# Patient Record
Sex: Male | Born: 1955 | Race: White | Hispanic: No | Marital: Married | Smoking: Never smoker
Health system: Southern US, Community
[De-identification: ages and names within clinical notes are randomized; demographics above are authoritative.]

## PROBLEM LIST (undated history)

## (undated) DIAGNOSIS — I639 Cerebral infarction, unspecified: Secondary | ICD-10-CM

## (undated) DIAGNOSIS — E785 Hyperlipidemia, unspecified: Secondary | ICD-10-CM

---

## 2013-10-25 ENCOUNTER — Emergency Department (HOSPITAL_COMMUNITY)
Admission: EM | Admit: 2013-10-25 | Discharge: 2013-10-26 | Disposition: A | Discharge status: Home / Self Care | Payer: Managed Care, Other (non HMO) | Attending: Emergency Medicine | Admitting: Emergency Medicine

## 2013-10-25 ENCOUNTER — Encounter (HOSPITAL_COMMUNITY): Payer: Self-pay | Admitting: Emergency Medicine

## 2013-10-25 ENCOUNTER — Emergency Department (HOSPITAL_COMMUNITY): Payer: Managed Care, Other (non HMO)

## 2013-10-25 DIAGNOSIS — Z880 Allergy status to penicillin: Secondary | ICD-10-CM | POA: Insufficient documentation

## 2013-10-25 DIAGNOSIS — R112 Nausea with vomiting, unspecified: Secondary | ICD-10-CM | POA: Insufficient documentation

## 2013-10-25 DIAGNOSIS — Z79899 Other long term (current) drug therapy: Secondary | ICD-10-CM | POA: Insufficient documentation

## 2013-10-25 DIAGNOSIS — E860 Dehydration: Secondary | ICD-10-CM | POA: Insufficient documentation

## 2013-10-25 DIAGNOSIS — R1084 Generalized abdominal pain: Secondary | ICD-10-CM | POA: Insufficient documentation

## 2013-10-25 LAB — CBC WITH DIFFERENTIAL/PLATELET
BASOS PCT: 0 % (ref 0–1)
Basophils Absolute: 0 10*3/uL (ref 0.0–0.1)
Eosinophils Absolute: 0 10*3/uL (ref 0.0–0.7)
Eosinophils Relative: 0 % (ref 0–5)
HCT: 40.2 % (ref 39.0–52.0)
Hemoglobin: 13.6 g/dL (ref 13.0–17.0)
LYMPHS ABS: 1 10*3/uL (ref 0.7–4.0)
Lymphocytes Relative: 6 % — ABNORMAL LOW (ref 12–46)
MCH: 30.9 pg (ref 26.0–34.0)
MCHC: 33.8 g/dL (ref 30.0–36.0)
MCV: 91.4 fL (ref 78.0–100.0)
Monocytes Absolute: 1.1 10*3/uL — ABNORMAL HIGH (ref 0.1–1.0)
Monocytes Relative: 6 % (ref 3–12)
Neutro Abs: 16.1 10*3/uL — ABNORMAL HIGH (ref 1.7–7.7)
Neutrophils Relative %: 88 % — ABNORMAL HIGH (ref 43–77)
Platelets: 224 10*3/uL (ref 150–400)
RBC: 4.4 MIL/uL (ref 4.22–5.81)
RDW: 13.3 % (ref 11.5–15.5)
WBC: 18.2 10*3/uL — AB (ref 4.0–10.5)

## 2013-10-25 LAB — BASIC METABOLIC PANEL
BUN: 24 mg/dL — AB (ref 6–23)
CO2: 23 mEq/L (ref 19–32)
Calcium: 9.5 mg/dL (ref 8.4–10.5)
Chloride: 99 mEq/L (ref 96–112)
Creatinine, Ser: 0.97 mg/dL (ref 0.50–1.35)
GFR calc non Af Amer: >90 mL/min (ref >90)
Glucose, Bld: 188 mg/dL — ABNORMAL HIGH (ref 70–99)
Potassium: 4.9 mEq/L (ref 3.7–5.3)
Sodium: 138 mEq/L (ref 137–147)

## 2013-10-25 LAB — TROPONIN I

## 2013-10-25 MED ORDER — ONDANSETRON 4 MG PO TBDP
8.0000 mg | ORAL_TABLET | Freq: Once | ORAL | Status: AC
  Administered 2013-10-25: 8 mg via ORAL
  Filled 2013-10-25: qty 2

## 2013-10-25 NOTE — ED Notes (Signed)
The pt went on a long bike ride and finished up around 1330 today.  He has had dizziness with n and v since.  No diarrhea

## 2013-10-26 LAB — URINALYSIS, ROUTINE W REFLEX MICROSCOPIC
Bilirubin Urine: NEGATIVE
Glucose, UA: NEGATIVE mg/dL
HGB URINE DIPSTICK: NEGATIVE
Ketones, ur: 40 mg/dL — AB
Leukocytes, UA: NEGATIVE
NITRITE: NEGATIVE
Protein, ur: NEGATIVE mg/dL
Specific Gravity, Urine: 1.024 (ref 1.005–1.030)
Urobilinogen, UA: 0.2 mg/dL (ref 0.0–1.0)
pH: 6 (ref 5.0–8.0)

## 2013-10-26 MED ORDER — ONDANSETRON HCL 4 MG/2ML IJ SOLN
4.0000 mg | Freq: Once | INTRAMUSCULAR | Status: AC
  Administered 2013-10-26: 4 mg via INTRAVENOUS
  Filled 2013-10-26: qty 2

## 2013-10-26 MED ORDER — ONDANSETRON 8 MG PO TBDP: 8.0000 mg | ORAL_TABLET | Freq: Three times a day (TID) | ORAL | Status: DC | PRN

## 2013-10-26 MED ORDER — SODIUM CHLORIDE 0.9 % IV SOLN: 1000.0000 mL | Freq: Once | INTRAVENOUS | Status: DC

## 2013-10-26 MED ORDER — SODIUM CHLORIDE 0.9 % IV SOLN
1000.0000 mL | Freq: Once | INTRAVENOUS | Status: AC
  Administered 2013-10-26: 1000 mL via INTRAVENOUS

## 2013-10-26 MED ORDER — SODIUM CHLORIDE 0.9 % IV SOLN
1000.0000 mL | INTRAVENOUS | Status: DC
  Administered 2013-10-26: 1000 mL via INTRAVENOUS

## 2013-10-26 NOTE — Discharge Instructions (Signed)
Dehydration, Adult Dehydration is when you lose more fluids from the body than you take in. Vital organs like the kidneys, brain, and heart cannot function without a proper amount of fluids and salt. Any loss of fluids from the body can cause dehydration.  CAUSES   Vomiting.  Diarrhea.  Excessive sweating.  Excessive urine output.  Fever. SYMPTOMS  Mild dehydration  Thirst.  Dry lips.  Slightly dry mouth. Moderate dehydration  Very dry mouth.  Sunken eyes.  Skin does not bounce back quickly when lightly pinched and released.  Dark urine and decreased urine production.  Decreased tear production.  Headache. Severe dehydration  Very dry mouth.  Extreme thirst.  Rapid, weak pulse (more than 100 beats per minute at rest).  Cold hands and feet.  Not able to sweat in spite of heat and temperature.  Rapid breathing.  Blue lips.  Confusion and lethargy.  Difficulty being awakened.  Minimal urine production.  No tears. DIAGNOSIS  Your caregiver will diagnose dehydration based on your symptoms and your exam. Blood and urine tests will help confirm the diagnosis. The diagnostic evaluation should also identify the cause of dehydration. TREATMENT  Treatment of mild or moderate dehydration can often be done at home by increasing the amount of fluids that you drink. It is best to drink small amounts of fluid more often. Drinking too much at one time can make vomiting worse. Refer to the home care instructions below. Severe dehydration needs to be treated at the hospital where you will probably be given intravenous (IV) fluids that contain water and electrolytes. HOME CARE INSTRUCTIONS   Ask your caregiver about specific rehydration instructions.  Drink enough fluids to keep your urine clear or pale yellow.  Drink small amounts frequently if you have nausea and vomiting.  Eat as you normally do.  Avoid:  Foods or drinks high in sugar.  Carbonated  drinks.  Juice.  Extremely hot or cold fluids.  Drinks with caffeine.  Fatty, greasy foods.  Alcohol.  Tobacco.  Overeating.  Gelatin desserts.  Wash your hands well to avoid spreading bacteria and viruses.  Only take over-the-counter or prescription medicines for pain, discomfort, or fever as directed by your caregiver.  Ask your caregiver if you should continue all prescribed and over-the-counter medicines.  Keep all follow-up appointments with your caregiver. SEEK MEDICAL CARE IF:  You have abdominal pain and it increases or stays in one area (localizes).  You have a rash, stiff neck, or severe headache.  You are irritable, sleepy, or difficult to awaken.  You are weak, dizzy, or extremely thirsty. SEEK IMMEDIATE MEDICAL CARE IF:   You are unable to keep fluids down or you get worse despite treatment.  You have frequent episodes of vomiting or diarrhea.  You have blood or green matter (bile) in your vomit.  You have blood in your stool or your stool looks black and tarry.  You have not urinated in 6 to 8 hours, or you have only urinated a small amount of very dark urine.  You have a fever.  You faint. MAKE SURE YOU:   Understand these instructions.  Will watch your condition.  Will get help right away if you are not doing well or get worse. Document Released: 06/12/2005 Document Revised: 09/04/2011 Document Reviewed: 01/30/2011 [facility] Patient Information 2014 [place], [place].  Nausea and Vomiting Nausea is a sick feeling that often comes before throwing up (vomiting). Vomiting is a reflex where stomach contents come out of your mouth.  Vomiting can cause severe loss of body fluids (dehydration). Children and elderly adults can become dehydrated quickly, especially if they also have diarrhea. Nausea and vomiting are symptoms of a condition or disease. It is important to find the cause of your symptoms. CAUSES   Direct irritation of the stomach lining.  This irritation can result from increased acid production (gastroesophageal reflux disease), infection, food poisoning, taking certain medicines (such as nonsteroidal anti-inflammatory drugs), alcohol use, or tobacco use.  Signals from the brain.These signals could be caused by a headache, heat exposure, an inner ear disturbance, increased pressure in the brain from injury, infection, a tumor, or a concussion, pain, emotional stimulus, or metabolic problems.  An obstruction in the gastrointestinal tract (bowel obstruction).  Illnesses such as diabetes, hepatitis, gallbladder problems, appendicitis, kidney problems, cancer, sepsis, atypical symptoms of a heart attack, or eating disorders.  Medical treatments such as chemotherapy and radiation.  Receiving medicine that makes you sleep (general anesthetic) during surgery. DIAGNOSIS Your caregiver may ask for tests to be done if the problems do not improve after a few days. Tests may also be done if symptoms are severe or if the reason for the nausea and vomiting is not clear. Tests may include:  Urine tests.  Blood tests.  Stool tests.  Cultures (to look for evidence of infection).  X-rays or other imaging studies. Test results can help your caregiver make decisions about treatment or the need for additional tests. TREATMENT You need to stay well hydrated. Drink frequently but in small amounts.You may wish to drink water, sports drinks, clear broth, or eat frozen ice pops or gelatin dessert to help stay hydrated.When you eat, eating slowly may help prevent nausea.There are also some antinausea medicines that may help prevent nausea. HOME CARE INSTRUCTIONS   Take all medicine as directed by your caregiver.  If you do not have an appetite, do not force yourself to eat. However, you must continue to drink fluids.  If you have an appetite, eat a normal diet unless your caregiver tells you differently.  Eat a variety of complex  carbohydrates (rice, wheat, potatoes, bread), lean meats, yogurt, fruits, and vegetables.  Avoid high-fat foods because they are more difficult to digest.  Drink enough water and fluids to keep your urine clear or pale yellow.  If you are dehydrated, ask your caregiver for specific rehydration instructions. Signs of dehydration may include:  Severe thirst.  Dry lips and mouth.  Dizziness.  Dark urine.  Decreasing urine frequency and amount.  Confusion.  Rapid breathing or pulse. SEEK IMMEDIATE MEDICAL CARE IF:   You have blood or brown flecks (like coffee grounds) in your vomit.  You have black or bloody stools.  You have a severe headache or stiff neck.  You are confused.  You have severe abdominal pain.  You have chest pain or trouble breathing.  You do not urinate at least once every 8 hours.  You develop cold or clammy skin.  You continue to vomit for longer than 24 to 48 hours.  You have a fever. MAKE SURE YOU:   Understand these instructions.  Will watch your condition.  Will get help right away if you are not doing well or get worse. Document Released: 06/12/2005 Document Revised: 09/04/2011 Document Reviewed: 11/09/2010 [facility] Patient Information 2014 [place], [place].  Rehydration, Adult Rehydration is the replacement of body fluids lost during dehydration. Dehydration is an extreme loss of body fluids to the point of body function impairment. There are many  ways extreme fluid loss can occur, including vomiting, diarrhea, or excess sweating. Recovering from dehydration requires replacing lost fluids, continuing to eat to maintain strength, and avoiding foods and beverages that may contribute to further fluid loss or may increase nausea. HOW TO REHYDRATE In most cases, rehydration involves the replacement of not only fluids but also carbohydrates and basic body salts. Rehydration with an oral rehydration solution is one way to replace essential  nutrients lost through dehydration. An oral rehydration solution can be purchased at pharmacies, retail stores, and online. Premixed packets of powder that you combine with water to make a solution are also sold. You can prepare an oral rehydration solution at home by mixing the following ingredients together:      tsp table salt.   tsp baking soda.   tsp salt substitute containing potassium chloride.  1 tablespoons sugar.  1 L (34 oz) of water. Be sure to use exact measurements. Including too much sugar can make diarrhea worse. Drink  1 cup (120 240 mL) of oral rehydration solution each time you have diarrhea or vomit. If drinking this amount makes your vomiting worse, try drinking smaller amounts more often. For example, drink 1 3 tsp every 5 10 minutes.  A general rule for staying hydrated is to drink 1 2 L of fluid per day. Talk to your caregiver about the specific amount you should be drinking each day. Drink enough fluids to keep your urine clear or pale yellow. EATING WHEN DEHYDRATED Even if you have had severe sweating or you are having diarrhea, do not stop eating. Many healthy items in a normal diet are okay to continue eating while recovering from dehydration. The following tips can help you to lessen nausea when you eat:  Ask someone else to prepare your food. Cooking smells may worsen nausea.  Eat in a well-ventilated room away from cooking smells.  Sit up when you eat. Avoid lying down until 1 2 hours after eating.  Eat small amounts when you eat.  Eat foods that are easy to digest. These include soft, well-cooked, or mashed foods. FOODS AND BEVERAGES TO AVOID Avoid eating or drinking the following foods and beverages that may increase nausea or further loss of fluid:   Fruit juices with a high sugar content, such as concentrated juices.  Alcohol.  Beverages containing caffeine.  Carbonated drinks. They may cause a lot of gas.  Foods that may cause a lot of gas,  such as cabbage, broccoli, and beans.  Fatty, greasy, and fried foods.  Spicy, very salty, and very sweet foods or drinks.  Foods or drinks that are very hot or very cold. Consume food or drinks at or near room temperature.  Foods that need a lot of chewing, such as raw vegetables.  Foods that are sticky or hard to swallow, such as peanut butter. Document Released: 09/04/2011 Document Revised: 03/06/2012 Document Reviewed: 09/04/2011 [facility] Patient Information 2014 [place], [place].

## 2013-10-26 NOTE — ED Notes (Signed)
Per Doctor, gave patient saltines and a Ginger Ale.

## 2013-10-26 NOTE — ED Provider Notes (Signed)
CSN: [id number]     Arrival date & time 10/25/13  2149 History   First MD Initiated Contact with Patient 10/25/13 2256     Chief Complaint  Patient presents with  . Nausea     (Consider location/radiation/quality/duration/timing/severity/associated sxs/prior Treatment) HPI 57 rolled male presents to emergency room from home with complaint of nausea and vomiting.  Patient went on a 60 mile bike ride today, does this regularly.  Immediately after the ride, he began to feel weak shaky and nauseated.  He reports multiple episodes of vomiting.  Patient tried crackers and Gatorade, but was unable to keep them down.  No known sick contacts, no unusual foods, no travel.  Patient denies diarrhea.  No chest pain, no shortness of breath, mild muscle soreness from vomiting but no abdominal pain.  He reports generalized weakness. History reviewed. No pertinent past medical history. History reviewed. No pertinent past surgical history. No family history on file. History  Substance Use Topics  . Smoking status: Never Smoker   . Smokeless tobacco: Not on file  . Alcohol Use: Yes    Review of Systems   See History of Present Illness; otherwise all other systems are reviewed and negative  Allergies  Penicillins  Home Medications   Prior to Admission medications   Medication Sig Start Date End Date Taking? Authorizing Provider  TURMERIC PO Take 3 tablets by mouth daily.   Yes Historical Provider, MD  Zinc 100 MG TABS Take 100 mg by mouth daily.   Yes Historical Provider, MD   BP 121/71  Pulse 58  Temp(Src) 97.8 F (36.6 C) (Oral)  Resp 12  Ht 5\' 6"  (1.676 m)  Wt 155 lb (70.308 kg)  BMI 25.03 kg/m2  SpO2 100% Physical Exam  Nursing note and vitals reviewed. Constitutional: He is oriented to person, place, and time. He appears well-developed and well-nourished.  HENT:  Head: Normocephalic and atraumatic.  Nose: Nose normal.  Mouth/Throat: Oropharynx is clear and moist.  Eyes:  Conjunctivae and EOM are normal. Pupils are equal, round, and reactive to light.  Neck: Normal range of motion. Neck supple. No JVD present. No tracheal deviation present. No thyromegaly present.  Cardiovascular: Normal rate, regular rhythm, normal heart sounds and intact distal pulses.  Exam reveals no gallop and no friction rub.   No murmur heard. Pulmonary/Chest: Effort normal and breath sounds normal. No stridor. No respiratory distress. He has no wheezes. He has no rales. He exhibits no tenderness.  Abdominal: Soft. Bowel sounds are normal. He exhibits no distension and no mass. There is tenderness (mild diffuse tenderness). There is no rebound and no guarding.  Musculoskeletal: Normal range of motion. He exhibits no edema and no tenderness.  Lymphadenopathy:    He has no cervical adenopathy.  Neurological: He is alert and oriented to person, place, and time. He exhibits normal muscle tone. Coordination normal.  Skin: Skin is warm and dry. No rash noted. No erythema. No pallor.  Psychiatric: He has a normal mood and affect. His behavior is normal. Judgment and thought content normal.    ED Course  Procedures (including critical care time) Labs Review Labs Reviewed  CBC WITH DIFFERENTIAL - Abnormal; Notable for the following:    WBC 18.2 (*)    Neutrophils Relative % 88 (*)    Neutro Abs 16.1 (*)    Lymphocytes Relative 6 (*)    Monocytes Absolute 1.1 (*)    All other components within normal limits  BASIC METABOLIC PANEL - Abnormal;  Notable for the following:    Glucose, Bld 188 (*)    BUN 24 (*)    All other components within normal limits  TROPONIN I  URINALYSIS, ROUTINE W REFLEX MICROSCOPIC    Imaging Review Dg Chest 2 View  10/25/2013   CLINICAL DATA:  Dizziness, nausea  EXAM: CHEST  2 VIEW  COMPARISON:  None.  FINDINGS: Lungs are clear.  No pleural effusion or pneumothorax.  The heart is normal in size.  Visualized osseous structures are within normal limits.  IMPRESSION:  No evidence of acute cardiopulmonary disease.   Electronically Signed   By: Charline BillsSriyesh  Krishnan M.D.   On: 10/25/2013 23:57     EKG Interpretation   Date/Time:  Saturday Oct 25 2013 21:57:30 EDT Ventricular Rate:  58 PR Interval:  184 QRS Duration: 98 QT Interval:  442 QTC Calculation: 433 R Axis:   71 Text Interpretation:  Sinus bradycardia Otherwise normal ECG No old  tracing to compare Confirmed by Olan Kurek  MD, Navaya Wiatrek ([id number]) on 10/25/2013  11:06:07 PM      MDM   Final diagnoses:  Nausea and vomiting  Dehydration    58 year old male with nausea and vomiting today.  Mild dehydration noted on physical exam.  Patient still feeling slightly nauseated.  He has leukocytosis which I suspect is a stress reaction to his vomiting.  Plan for IV hydration, IV Zofran, PO challenge and expect to be able to be discharged home.    Olivia Mackielga M Brynnlee Cumpian, MD 10/26/13 [phone]

## 2018-07-29 DIAGNOSIS — M9901 Segmental and somatic dysfunction of cervical region: Secondary | ICD-10-CM | POA: Diagnosis not present

## 2018-07-31 DIAGNOSIS — M9901 Segmental and somatic dysfunction of cervical region: Secondary | ICD-10-CM | POA: Diagnosis not present

## 2018-08-05 DIAGNOSIS — M9901 Segmental and somatic dysfunction of cervical region: Secondary | ICD-10-CM | POA: Diagnosis not present

## 2018-08-08 DIAGNOSIS — M9901 Segmental and somatic dysfunction of cervical region: Secondary | ICD-10-CM | POA: Diagnosis not present

## 2018-08-13 DIAGNOSIS — M9901 Segmental and somatic dysfunction of cervical region: Secondary | ICD-10-CM | POA: Diagnosis not present

## 2018-08-15 DIAGNOSIS — M9901 Segmental and somatic dysfunction of cervical region: Secondary | ICD-10-CM | POA: Diagnosis not present

## 2018-08-21 DIAGNOSIS — M9901 Segmental and somatic dysfunction of cervical region: Secondary | ICD-10-CM | POA: Diagnosis not present

## 2018-08-29 DIAGNOSIS — M9901 Segmental and somatic dysfunction of cervical region: Secondary | ICD-10-CM | POA: Diagnosis not present

## 2018-09-03 DIAGNOSIS — M9901 Segmental and somatic dysfunction of cervical region: Secondary | ICD-10-CM | POA: Diagnosis not present

## 2018-09-11 DIAGNOSIS — M9901 Segmental and somatic dysfunction of cervical region: Secondary | ICD-10-CM | POA: Diagnosis not present

## 2018-09-13 DIAGNOSIS — E7800 Pure hypercholesterolemia, unspecified: Secondary | ICD-10-CM | POA: Diagnosis not present

## 2018-09-13 DIAGNOSIS — Z125 Encounter for screening for malignant neoplasm of prostate: Secondary | ICD-10-CM | POA: Diagnosis not present

## 2018-09-13 DIAGNOSIS — Z0000 Encounter for general adult medical examination without abnormal findings: Secondary | ICD-10-CM | POA: Diagnosis not present

## 2018-09-17 DIAGNOSIS — M9901 Segmental and somatic dysfunction of cervical region: Secondary | ICD-10-CM | POA: Diagnosis not present

## 2018-09-24 DIAGNOSIS — M9901 Segmental and somatic dysfunction of cervical region: Secondary | ICD-10-CM | POA: Diagnosis not present

## 2018-10-01 DIAGNOSIS — M9901 Segmental and somatic dysfunction of cervical region: Secondary | ICD-10-CM | POA: Diagnosis not present

## 2018-10-08 DIAGNOSIS — M9901 Segmental and somatic dysfunction of cervical region: Secondary | ICD-10-CM | POA: Diagnosis not present

## 2018-10-15 DIAGNOSIS — M9901 Segmental and somatic dysfunction of cervical region: Secondary | ICD-10-CM | POA: Diagnosis not present

## 2018-10-22 DIAGNOSIS — M9901 Segmental and somatic dysfunction of cervical region: Secondary | ICD-10-CM | POA: Diagnosis not present

## 2018-10-29 DIAGNOSIS — M9901 Segmental and somatic dysfunction of cervical region: Secondary | ICD-10-CM | POA: Diagnosis not present

## 2018-11-05 DIAGNOSIS — M9901 Segmental and somatic dysfunction of cervical region: Secondary | ICD-10-CM | POA: Diagnosis not present

## 2018-11-12 DIAGNOSIS — M9901 Segmental and somatic dysfunction of cervical region: Secondary | ICD-10-CM | POA: Diagnosis not present

## 2019-01-12 ENCOUNTER — Emergency Department (HOSPITAL_COMMUNITY): Payer: Commercial Managed Care - PPO

## 2019-01-12 ENCOUNTER — Inpatient Hospital Stay (HOSPITAL_COMMUNITY)
Admission: EM | Admit: 2019-01-12 | Discharge: 2019-01-14 | DRG: 024 | Disposition: A | Discharge status: Home / Self Care | Payer: Commercial Managed Care - PPO | Attending: Neurology | Admitting: Neurology

## 2019-01-12 DIAGNOSIS — R29700 NIHSS score 0: Secondary | ICD-10-CM | POA: Diagnosis present

## 2019-01-12 DIAGNOSIS — I63511 Cerebral infarction due to unspecified occlusion or stenosis of right middle cerebral artery: Principal | ICD-10-CM | POA: Diagnosis present

## 2019-01-12 DIAGNOSIS — Z880 Allergy status to penicillin: Secondary | ICD-10-CM

## 2019-01-12 DIAGNOSIS — I639 Cerebral infarction, unspecified: Secondary | ICD-10-CM

## 2019-01-12 DIAGNOSIS — R4781 Slurred speech: Secondary | ICD-10-CM | POA: Diagnosis present

## 2019-01-12 DIAGNOSIS — I6601 Occlusion and stenosis of right middle cerebral artery: Secondary | ICD-10-CM | POA: Diagnosis present

## 2019-01-12 DIAGNOSIS — E785 Hyperlipidemia, unspecified: Secondary | ICD-10-CM | POA: Diagnosis present

## 2019-01-12 DIAGNOSIS — D62 Acute posthemorrhagic anemia: Secondary | ICD-10-CM | POA: Diagnosis present

## 2019-01-12 DIAGNOSIS — R29810 Facial weakness: Secondary | ICD-10-CM | POA: Diagnosis present

## 2019-01-12 DIAGNOSIS — R471 Dysarthria and anarthria: Secondary | ICD-10-CM | POA: Diagnosis present

## 2019-01-12 DIAGNOSIS — Z1159 Encounter for screening for other viral diseases: Secondary | ICD-10-CM

## 2019-01-12 HISTORY — DX: Hyperlipidemia, unspecified: E78.5

## 2019-01-12 LAB — DIFFERENTIAL
Abs Immature Granulocytes: 0.01 10*3/uL (ref 0.00–0.07)
Basophils Absolute: 0.1 10*3/uL (ref 0.0–0.1)
Basophils Relative: 1 %
Eosinophils Absolute: 0.2 10*3/uL (ref 0.0–0.5)
Eosinophils Relative: 2 %
Immature Granulocytes: 0 %
Lymphocytes Relative: 37 %
Lymphs Abs: 3.5 10*3/uL (ref 0.7–4.0)
Monocytes Absolute: 1.1 10*3/uL — ABNORMAL HIGH (ref 0.1–1.0)
Monocytes Relative: 12 %
Neutro Abs: 4.5 10*3/uL (ref 1.7–7.7)
Neutrophils Relative %: 48 %

## 2019-01-12 LAB — I-STAT CHEM 8, ED
BUN: 19 mg/dL (ref 8–23)
Calcium, Ion: 1.07 mmol/L — ABNORMAL LOW (ref 1.15–1.40)
Chloride: 110 mmol/L (ref 98–111)
Creatinine, Ser: 1.2 mg/dL (ref 0.61–1.24)
Glucose, Bld: 110 mg/dL — ABNORMAL HIGH (ref 70–99)
HCT: 41 % (ref 39.0–52.0)
Hemoglobin: 13.9 g/dL (ref 13.0–17.0)
Potassium: 4.1 mmol/L (ref 3.5–5.1)
Sodium: 141 mmol/L (ref 135–145)
TCO2: 24 mmol/L (ref 22–32)

## 2019-01-12 LAB — CBC
HCT: 43.1 % (ref 39.0–52.0)
Hemoglobin: 13.9 g/dL (ref 13.0–17.0)
MCH: 30.5 pg (ref 26.0–34.0)
MCHC: 32.3 g/dL (ref 30.0–36.0)
MCV: 94.7 fL (ref 80.0–100.0)
Platelets: 196 10*3/uL (ref 150–400)
RBC: 4.55 MIL/uL (ref 4.22–5.81)
RDW: 13.1 % (ref 11.5–15.5)
WBC: 9.3 10*3/uL (ref 4.0–10.5)
nRBC: 0 % (ref 0.0–0.2)

## 2019-01-12 MED ORDER — SODIUM CHLORIDE 0.9% FLUSH
3.0000 mL | Freq: Once | INTRAVENOUS | Status: DC
Start: 2019-01-12 — End: 2019-01-13

## 2019-01-12 NOTE — ED Notes (Signed)
Blood collected

## 2019-01-12 NOTE — ED Provider Notes (Signed)
MSE was initiated and I personally evaluated the patient and placed orders (if any) at  11:43 PM on January 12, 2019.  The patient appears stable so that the remainder of the MSE may be completed by another provider.  Patient brought in by EMS as a code stroke, left-sided facial droop, difficulty speaking, abnormal gait with last known normal 9 PM.  Patient is awake and alert with patent airway, safe to go to CT scan.   Delora Fuel, MD [birth year] [phone]

## 2019-01-12 NOTE — H&P (Signed)
Referring Physician: Dr. Jodi MourningZavitz    Chief Complaint: Left facial droop  HPI: Alex DomKenneth Heath is an 63 y.o. male with PMHx of HLD who otherwise healthy and athleitic, having ridden his bicycle 30 miles yesterday. LKN 9:00 PM, when he went to bed. He woke up at about 10:50 and wife noted that his speech was abnormal/garbled and difficult to understand in addition to left facial droop. She tested him with a stroke assessment and noticed LUE drift. EMS was called and on arrival he was still in his bed. When they assessed him he had the above findings as well as abnormal/unsteady gait.   Not on a blood thinner. He takes a cholesterol lowering medication.   LSN: 9:00 PM tPA Given: No: Intracranial mass  No past medical history on file.  No past surgical history on file.  No family history on file. Social History:  reports that he has never smoked. He does not have any smokeless tobacco history on file. He reports current alcohol use. No history on file for drug.  Allergies:  Allergies  Allergen Reactions  . Penicillins Other (See Comments)    childhood    Home Medications:  Atorvastatin  ROS: As per HPI. Comprehensive ROS deferred due to acuity of presentation.   Physical Examination: There were no vitals taken for this visit.  HEENT: [place]/AT Lungs: Respirations unlabored Ext: No edema  Neurologic Examination: Mental Status: Alert, fully oriented.  Speech with significant dysarthria. In this context, speech is fluent with intact comprehension and naming.  Cranial Nerves: II:  Visual fields intact with no extinction to DSS. PERRL. III,IV, VI: No ptosis.  EOMI without nystagmus.  V,VII: Prominent weakness of LLQ of face. Left palpebral fissure slightly wider than right but eye blink and brow furrowing are normal and equal bilaterally VIII: hearing intact to voice IX,X: Palate rises symmetrically XI: Symmetric XII: midline tongue extension  Motor: Right : Upper extremity    5/5    Left:     Upper extremity   5/5  Lower extremity   5/5     Lower extremity   5/5 No pronator drift Rotating fingers test normal Fine tremor to digits of left hand and of left quadriceps noted intermittently Sensory: Temp and light touch intact throughout, bilaterally. No extinction Deep Tendon Reflexes:  2+ bilateral brachoradialis and biceps 3+ patellae bilaterally  2+ achilles bilaterally  Toes downgoing bilaterally  Cerebellar: No ataxia with FNF and H-S bilaterally.  Gait: Deferred due to acuity of presentation.    No results found for this or any previous visit (from the past 48 hour(s)). No results found.  Assessment: 63 y.o. male presenting with acute onset of left facial droop and dysarthria.  1. CT with no acute hypodensity or hemorrhage. Positive for dense MCA sign (right M2 segment). 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  2. 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  3. CTP with 5 cc core infarction and 100 cc penumbra in right MCA territory.  4. Stroke Risk Factors - HLD  Plan: 1. The patient is not a candidate for tPA due to intracranial mass 2. The patient is a candidate for VIR. Discussed risks/benefits extensively with patient, who wishes to proceed. All questions answered. Code VIR called to [facility]. Discussed with Dr. Corliss Skainseveshwar.   3. Following VIR, will be admitted  to the ICU under the Neurology service.  4. Stroke work up to include MRI brain with and without contrast. This study will also be used to further characterize the mass in his right middle cranial fossa.  5. TTE 6. Cardiac telemetry 7. PT consult, OT consult, Speech consult 9. No anticoagulants for at least 24 hours following VIR. DVT prophylaxis with SCDs.  10. Repeat CT head in 24 hours to rule out hemorrhagic  conversion. If no hemorrhage, will need to be started on either an antiplatelet agent or anticoagulant, depending on results of TTE, MRI and cardiac telemetry.  11. Frequent neuro checks  80 minutes spent in the emergent neurological evaluation and management of this critically ill acute stroke patient  @Electronically  signed: Dr. Kerney Elbe 01/12/2019, 11:46 PM

## 2019-01-13 ENCOUNTER — Other Ambulatory Visit (HOSPITAL_COMMUNITY): Payer: Self-pay

## 2019-01-13 ENCOUNTER — Inpatient Hospital Stay (HOSPITAL_COMMUNITY): Payer: Commercial Managed Care - PPO

## 2019-01-13 ENCOUNTER — Inpatient Hospital Stay (HOSPITAL_COMMUNITY): Payer: Commercial Managed Care - PPO | Admitting: Anesthesiology

## 2019-01-13 ENCOUNTER — Other Ambulatory Visit: Payer: Self-pay

## 2019-01-13 ENCOUNTER — Encounter (HOSPITAL_COMMUNITY)
Admission: EM | Disposition: A | Discharge status: Home / Self Care | Payer: Self-pay | Source: Home / Self Care | Attending: Neurology

## 2019-01-13 ENCOUNTER — Encounter (HOSPITAL_COMMUNITY): Payer: Self-pay

## 2019-01-13 ENCOUNTER — Emergency Department (HOSPITAL_COMMUNITY): Payer: Commercial Managed Care - PPO

## 2019-01-13 DIAGNOSIS — E785 Hyperlipidemia, unspecified: Secondary | ICD-10-CM | POA: Diagnosis present

## 2019-01-13 DIAGNOSIS — R29700 NIHSS score 0: Secondary | ICD-10-CM | POA: Diagnosis present

## 2019-01-13 DIAGNOSIS — I639 Cerebral infarction, unspecified: Secondary | ICD-10-CM

## 2019-01-13 DIAGNOSIS — I6389 Other cerebral infarction: Secondary | ICD-10-CM | POA: Diagnosis not present

## 2019-01-13 DIAGNOSIS — D62 Acute posthemorrhagic anemia: Secondary | ICD-10-CM | POA: Diagnosis present

## 2019-01-13 DIAGNOSIS — I6601 Occlusion and stenosis of right middle cerebral artery: Secondary | ICD-10-CM

## 2019-01-13 DIAGNOSIS — I63511 Cerebral infarction due to unspecified occlusion or stenosis of right middle cerebral artery: Secondary | ICD-10-CM | POA: Diagnosis present

## 2019-01-13 DIAGNOSIS — R29810 Facial weakness: Secondary | ICD-10-CM | POA: Diagnosis present

## 2019-01-13 DIAGNOSIS — Z880 Allergy status to penicillin: Secondary | ICD-10-CM | POA: Diagnosis not present

## 2019-01-13 DIAGNOSIS — R471 Dysarthria and anarthria: Secondary | ICD-10-CM | POA: Diagnosis present

## 2019-01-13 DIAGNOSIS — Z1159 Encounter for screening for other viral diseases: Secondary | ICD-10-CM | POA: Diagnosis not present

## 2019-01-13 DIAGNOSIS — R4781 Slurred speech: Secondary | ICD-10-CM | POA: Diagnosis present

## 2019-01-13 HISTORY — PX: RADIOLOGY WITH ANESTHESIA: SHX6223

## 2019-01-13 HISTORY — PX: IR ANGIO INTRA EXTRACRAN SEL COM CAROTID INNOMINATE UNI L MOD SED: IMG5358

## 2019-01-13 HISTORY — PX: IR ANGIO VERTEBRAL SEL SUBCLAVIAN INNOMINATE BILAT MOD SED: IMG5366

## 2019-01-13 HISTORY — PX: IR PERCUTANEOUS ART THROMBECTOMY/INFUSION INTRACRANIAL INC DIAG ANGIO: IMG6087

## 2019-01-13 HISTORY — PX: IR CT HEAD LTD: IMG2386

## 2019-01-13 HISTORY — PX: IR INTRAVSC STENT CERV CAROTID W/O EMB-PROT MOD SED INC ANGIO: IMG2304

## 2019-01-13 LAB — COMPREHENSIVE METABOLIC PANEL
ALT: 27 U/L (ref 0–44)
AST: 26 U/L (ref 15–41)
Albumin: 4.1 g/dL (ref 3.5–5.0)
Alkaline Phosphatase: 43 U/L (ref 38–126)
Anion gap: 9 (ref 5–15)
BUN: 16 mg/dL (ref 8–23)
CO2: 22 mmol/L (ref 22–32)
Calcium: 9.4 mg/dL (ref 8.9–10.3)
Chloride: 109 mmol/L (ref 98–111)
Creatinine, Ser: 1.21 mg/dL (ref 0.61–1.24)
GFR calc Af Amer: >60 mL/min (ref >60)
GFR calc non Af Amer: >60 mL/min (ref >60)
Glucose, Bld: 111 mg/dL — ABNORMAL HIGH (ref 70–99)
Potassium: 4.1 mmol/L (ref 3.5–5.1)
Sodium: 140 mmol/L (ref 135–145)
Total Bilirubin: 0.7 mg/dL (ref 0.3–1.2)
Total Protein: 6.9 g/dL (ref 6.5–8.1)

## 2019-01-13 LAB — MRSA PCR SCREENING: MRSA by PCR: NEGATIVE

## 2019-01-13 LAB — BASIC METABOLIC PANEL
Anion gap: 8 (ref 5–15)
BUN: 11 mg/dL (ref 8–23)
CO2: 19 mmol/L — ABNORMAL LOW (ref 22–32)
Calcium: 8 mg/dL — ABNORMAL LOW (ref 8.9–10.3)
Chloride: 111 mmol/L (ref 98–111)
Creatinine, Ser: 1.1 mg/dL (ref 0.61–1.24)
GFR calc Af Amer: >60 mL/min (ref >60)
GFR calc non Af Amer: >60 mL/min (ref >60)
Glucose, Bld: 110 mg/dL — ABNORMAL HIGH (ref 70–99)
Potassium: 3.7 mmol/L (ref 3.5–5.1)
Sodium: 138 mmol/L (ref 135–145)

## 2019-01-13 LAB — CBC WITH DIFFERENTIAL/PLATELET
Abs Immature Granulocytes: 0.06 10*3/uL (ref 0.00–0.07)
Basophils Absolute: 0 10*3/uL (ref 0.0–0.1)
Basophils Relative: 0 %
Eosinophils Absolute: 0 10*3/uL (ref 0.0–0.5)
Eosinophils Relative: 0 %
HCT: 34.5 % — ABNORMAL LOW (ref 39.0–52.0)
Hemoglobin: 11.3 g/dL — ABNORMAL LOW (ref 13.0–17.0)
Immature Granulocytes: 1 %
Lymphocytes Relative: 9 %
Lymphs Abs: 1.2 10*3/uL (ref 0.7–4.0)
MCH: 30.4 pg (ref 26.0–34.0)
MCHC: 32.8 g/dL (ref 30.0–36.0)
MCV: 92.7 fL (ref 80.0–100.0)
Monocytes Absolute: 0.9 10*3/uL (ref 0.1–1.0)
Monocytes Relative: 7 %
Neutro Abs: 10.5 10*3/uL — ABNORMAL HIGH (ref 1.7–7.7)
Neutrophils Relative %: 83 %
Platelets: 181 10*3/uL (ref 150–400)
RBC: 3.72 MIL/uL — ABNORMAL LOW (ref 4.22–5.81)
RDW: 13.2 % (ref 11.5–15.5)
WBC: 12.7 10*3/uL — ABNORMAL HIGH (ref 4.0–10.5)
nRBC: 0 % (ref 0.0–0.2)

## 2019-01-13 LAB — ECHOCARDIOGRAM COMPLETE
Height: 66 in
Weight: 2663.16 oz

## 2019-01-13 LAB — SARS CORONAVIRUS 2 BY RT PCR (HOSPITAL ORDER, PERFORMED IN ~~LOC~~ HOSPITAL LAB): SARS Coronavirus 2: NEGATIVE

## 2019-01-13 LAB — LIPID PANEL
Cholesterol: 153 mg/dL (ref 0–200)
HDL: 56 mg/dL (ref >40)
LDL Cholesterol: 83 mg/dL (ref 0–99)
Total CHOL/HDL Ratio: 2.7 RATIO
Triglycerides: 72 mg/dL (ref <150)
VLDL: 14 mg/dL (ref 0–40)

## 2019-01-13 LAB — HIV ANTIBODY (ROUTINE TESTING W REFLEX): HIV Screen 4th Generation wRfx: NONREACTIVE

## 2019-01-13 LAB — HEMOGLOBIN A1C
Hgb A1c MFr Bld: 6 % — ABNORMAL HIGH (ref 4.8–5.6)
Mean Plasma Glucose: 125.5 mg/dL

## 2019-01-13 LAB — PROTIME-INR
INR: 1 (ref 0.8–1.2)
Prothrombin Time: 12.8 seconds (ref 11.4–15.2)

## 2019-01-13 LAB — CBG MONITORING, ED: Glucose-Capillary: 98 mg/dL (ref 70–99)

## 2019-01-13 LAB — APTT: aPTT: 25 seconds (ref 24–36)

## 2019-01-13 SURGERY — RADIOLOGY WITH ANESTHESIA
Anesthesia: General

## 2019-01-13 MED ORDER — ACETAMINOPHEN 160 MG/5ML PO SOLN: 650.0000 mg | ORAL | Status: DC | PRN

## 2019-01-13 MED ORDER — PHENOL 1.4 % MT LIQD
1.0000 | OROMUCOSAL | Status: DC | PRN
  Administered 2019-01-13: 1 via OROMUCOSAL
  Filled 2019-01-13: qty 177

## 2019-01-13 MED ORDER — STROKE: EARLY STAGES OF RECOVERY BOOK: Freq: Once | Status: DC

## 2019-01-13 MED ORDER — MENTHOL 3 MG MT LOZG
1.0000 | LOZENGE | OROMUCOSAL | Status: DC | PRN
  Administered 2019-01-13: 3 mg via ORAL
  Filled 2019-01-13: qty 9

## 2019-01-13 MED ORDER — CLOPIDOGREL BISULFATE 300 MG PO TABS
ORAL_TABLET | ORAL | Status: AC
  Filled 2019-01-13: qty 1

## 2019-01-13 MED ORDER — ATORVASTATIN CALCIUM 40 MG PO TABS
40.0000 mg | ORAL_TABLET | Freq: Every day | ORAL | Status: DC
  Administered 2019-01-13: 18:00:00 40 mg via ORAL
  Filled 2019-01-13: qty 1

## 2019-01-13 MED ORDER — LIDOCAINE HCL (CARDIAC) PF 100 MG/5ML IV SOSY
PREFILLED_SYRINGE | INTRAVENOUS | Status: DC | PRN
  Administered 2019-01-13: 80 mg via INTRATRACHEAL

## 2019-01-13 MED ORDER — TICAGRELOR 90 MG PO TABS
ORAL_TABLET | ORAL | Status: AC
  Administered 2019-01-13: 180 mg via OROGASTRIC
  Filled 2019-01-13: qty 2

## 2019-01-13 MED ORDER — TICAGRELOR 90 MG PO TABS
90.0000 mg | ORAL_TABLET | Freq: Two times a day (BID) | ORAL | Status: DC
  Administered 2019-01-13: 90 mg via ORAL
  Filled 2019-01-13 (×2): qty 1

## 2019-01-13 MED ORDER — GLYCOPYRROLATE 0.2 MG/ML IJ SOLN
INTRAMUSCULAR | Status: DC | PRN
  Administered 2019-01-13: .2 mg via INTRAVENOUS

## 2019-01-13 MED ORDER — ASPIRIN 81 MG PO CHEW: 81.0000 mg | CHEWABLE_TABLET | Freq: Every day | ORAL | Status: DC

## 2019-01-13 MED ORDER — GADOBUTROL 1 MMOL/ML IV SOLN
7.0000 mL | Freq: Once | INTRAVENOUS | Status: AC | PRN
  Administered 2019-01-13: 7 mL via INTRAVENOUS

## 2019-01-13 MED ORDER — ACETAMINOPHEN 650 MG RE SUPP: 650.0000 mg | RECTAL | Status: DC | PRN

## 2019-01-13 MED ORDER — LIDOCAINE HCL 1 % IJ SOLN
INTRAMUSCULAR | Status: AC
  Filled 2019-01-13: qty 20

## 2019-01-13 MED ORDER — SUGAMMADEX SODIUM 200 MG/2ML IV SOLN
INTRAVENOUS | Status: DC | PRN
  Administered 2019-01-13: 200 mg via INTRAVENOUS

## 2019-01-13 MED ORDER — VANCOMYCIN HCL 1000 MG IV SOLR
INTRAVENOUS | Status: DC | PRN
  Administered 2019-01-13: 1000 mg via INTRAVENOUS

## 2019-01-13 MED ORDER — PROPOFOL 10 MG/ML IV BOLUS
INTRAVENOUS | Status: DC | PRN
  Administered 2019-01-13: 40 mg via INTRAVENOUS
  Administered 2019-01-13: 160 mg via INTRAVENOUS

## 2019-01-13 MED ORDER — ONDANSETRON HCL 4 MG/2ML IJ SOLN
INTRAMUSCULAR | Status: DC | PRN
  Administered 2019-01-13: 4 mg via INTRAVENOUS

## 2019-01-13 MED ORDER — SODIUM CHLORIDE 0.9 % IV SOLN
INTRAVENOUS | Status: DC | PRN
  Administered 2019-01-13: 02:00:00 via INTRAVENOUS

## 2019-01-13 MED ORDER — ACETAMINOPHEN 325 MG PO TABS: 650.0000 mg | ORAL_TABLET | ORAL | Status: DC | PRN

## 2019-01-13 MED ORDER — NITROGLYCERIN 1 MG/10 ML FOR IR/CATH LAB
INTRA_ARTERIAL | Status: AC
  Administered 2019-01-13 (×2): 25 ug via INTRA_ARTERIAL
  Filled 2019-01-13: qty 10

## 2019-01-13 MED ORDER — TICAGRELOR 90 MG PO TABS: 90.0000 mg | ORAL_TABLET | Freq: Two times a day (BID) | ORAL | Status: DC

## 2019-01-13 MED ORDER — CLEVIDIPINE BUTYRATE 0.5 MG/ML IV EMUL: 0.0000 mg/h | INTRAVENOUS | Status: DC

## 2019-01-13 MED ORDER — LABETALOL HCL 5 MG/ML IV SOLN
INTRAVENOUS | Status: DC | PRN
  Administered 2019-01-13: 5 mg via INTRAVENOUS

## 2019-01-13 MED ORDER — SODIUM CHLORIDE 0.9 % IV SOLN
INTRAVENOUS | Status: DC
  Administered 2019-01-13 – 2019-01-14 (×2): via INTRAVENOUS

## 2019-01-13 MED ORDER — EPHEDRINE SULFATE 50 MG/ML IJ SOLN
INTRAMUSCULAR | Status: DC | PRN
  Administered 2019-01-13: 10 mg via INTRAVENOUS
  Administered 2019-01-13 (×2): 5 mg via INTRAVENOUS
  Administered 2019-01-13: 10 mg via INTRAVENOUS

## 2019-01-13 MED ORDER — SENNOSIDES-DOCUSATE SODIUM 8.6-50 MG PO TABS: 1.0000 | ORAL_TABLET | Freq: Every evening | ORAL | Status: DC | PRN

## 2019-01-13 MED ORDER — ROCURONIUM BROMIDE 100 MG/10ML IV SOLN
INTRAVENOUS | Status: DC | PRN
  Administered 2019-01-13: 20 mg via INTRAVENOUS
  Administered 2019-01-13: 50 mg via INTRAVENOUS

## 2019-01-13 MED ORDER — TIROFIBAN HCL IN NACL 5-0.9 MG/100ML-% IV SOLN
INTRAVENOUS | Status: AC
  Filled 2019-01-13: qty 100

## 2019-01-13 MED ORDER — IOHEXOL 350 MG/ML SOLN
125.0000 mL | Freq: Once | INTRAVENOUS | Status: AC | PRN
  Administered 2019-01-13: 125 mL via INTRAVENOUS

## 2019-01-13 MED ORDER — VANCOMYCIN HCL IN DEXTROSE 1-5 GM/200ML-% IV SOLN
INTRAVENOUS | Status: AC
  Filled 2019-01-13: qty 200

## 2019-01-13 MED ORDER — SUCCINYLCHOLINE CHLORIDE 20 MG/ML IJ SOLN
INTRAMUSCULAR | Status: DC | PRN
  Administered 2019-01-13: 120 mg via INTRAVENOUS

## 2019-01-13 MED ORDER — EPTIFIBATIDE 20 MG/10ML IV SOLN
INTRAVENOUS | Status: AC
  Administered 2019-01-13 (×2): 1 mg via INTRA_ARTERIAL
  Administered 2019-01-13: 2 mg via INTRA_ARTERIAL
  Filled 2019-01-13: qty 10

## 2019-01-13 MED ORDER — SODIUM CHLORIDE 0.9 % IV SOLN
INTRAVENOUS | Status: DC
  Administered 2019-01-13: 05:00:00 via INTRAVENOUS

## 2019-01-13 MED ORDER — ASPIRIN 325 MG PO TABS
ORAL_TABLET | ORAL | Status: AC
  Filled 2019-01-13: qty 1

## 2019-01-13 MED ORDER — ASPIRIN 81 MG PO CHEW
CHEWABLE_TABLET | ORAL | Status: AC
  Administered 2019-01-13: 81 mg via OROGASTRIC
  Filled 2019-01-13: qty 1

## 2019-01-13 MED ORDER — CHLORHEXIDINE GLUCONATE CLOTH 2 % EX PADS: 6.0000 | MEDICATED_PAD | Freq: Every day | CUTANEOUS | Status: DC

## 2019-01-13 MED ORDER — ASPIRIN 81 MG PO CHEW
81.0000 mg | CHEWABLE_TABLET | Freq: Every day | ORAL | Status: DC
  Administered 2019-01-13: 81 mg via ORAL
  Filled 2019-01-13 (×2): qty 1

## 2019-01-13 MED ORDER — SODIUM CHLORIDE 0.9 % IV SOLN
INTRAVENOUS | Status: DC | PRN
  Administered 2019-01-13: 02:00:00 40 ug/min via INTRAVENOUS

## 2019-01-13 NOTE — Transfer of Care (Signed)
Immediate Anesthesia Transfer of Care Note  Patient: Alex Heath  Procedure(s) Performed: RADIOLOGY WITH ANESTHESIA (N/A )  Patient Location: ICU  Anesthesia Type:General  Level of Consciousness: drowsy  Airway & Oxygen Therapy: Patient Spontanous Breathing and Patient connected to face mask oxygen  Post-op Assessment: Report given to RN and Post -op Vital signs reviewed and stable  Post vital signs: Reviewed and stable  Last Vitals:  Vitals Value Taken Time  BP    Temp    Pulse    Resp    SpO2      Last Pain:  Vitals:   01/13/19 0024  TempSrc:   PainSc: 0-No pain         Complications: No apparent anesthesia complications

## 2019-01-13 NOTE — Progress Notes (Signed)
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 Deveshwar. 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 Deveshwar. Patient awake and alert laying in bed with no complaints at this time. Can spontaneously move all extremities. Right and left groin incisions c/d/i.   Allergies: Penicillins  Medications: Prior to Admission medications   Medication Sig Start Date End Date Taking? Authorizing Provider  ondansetron (ZOFRAN ODT) 8 MG disintegrating tablet Take 1 tablet (8 mg total) by mouth every 8 (eight) hours as needed for nausea or vomiting. 10/26/13   Otter, Olga, MD  TURMERIC PO Take 3 tablets by mouth daily.    [provider]  Zinc 100 MG TABS Take 100 mg by mouth daily.    [provider]     Vital Signs: BP (!) 144/56 Comment: <RI   Pulse 62    Temp 97.9 F (36.6 C) (Oral)    Resp 13    Ht 5\' 6"  (1.676 m)    Wt 166 lb 7.2 oz (75.5 kg)    SpO2 100%    BMI 26.87 kg/m   Physical Exam Vitals signs and nursing note reviewed.  Constitutional:      General: He is not in acute distress.    Appearance: Normal appearance.  Pulmonary:     Effort: Pulmonary effort is normal. No respiratory distress.  Skin:    General: Skin is warm and dry.     Comments: Right and left groin incisions soft without active bleeding or hematoma.  Neurological:     Mental Status: He is alert.     Comments: Alert, awake, and oriented x3. Speech and comprehension intact. PERRL bilaterally. No facial asymmetry. Tongue midline. Can spontaneously move all extremities. No pronator drift. Distal pulses 2+ bilaterally.  Psychiatric:        Mood and Affect: Mood normal.        Behavior: Behavior normal.      Thought Content: Thought content normal.        Judgment: Judgment normal.     Imaging: Ct Code Stroke Cta Head W/wo Contrast  Result Date: 01/13/2019 CLINICAL DATA:  Left-sided weakness and facial droop EXAM: CT ANGIOGRAPHY HEAD AND NECK CT PERFUSION BRAIN TECHNIQUE: Multidetector CT imaging of the head and neck was performed using the [id number] Placeeltyl [id number] Prudy Fee N: There is no bony spinal canal stenosis. No lytic or blastic lesion. OTHER NECK: Normal pharynx, larynx and major salivary glands. No cervical lymphadenopathy. Unremarkable thyroid gland. UPPER CHEST: No pneumothorax or pleural effusion. No nodules or masses. AORTIC ARCH: There is no calcific atherosclerosis of the aortic arch. There is no aneurysm, dissection or hemodynamically significant stenosis of the visualized ascending aorta and aortic arch. Conventional 3 vessel aortic branching pattern. The visualized proximal subclavian arteries are widely patent. RIGHT CAROTID SYSTEM: --Common carotid artery:  Widely patent origin without common carotid artery dissection or aneurysm. --Internal carotid artery: The right ICA is occluded at its origin. --External carotid artery: No acute abnormality. LEFT CAROTID SYSTEM: --Common carotid artery: Widely patent origin without common carotid artery dissection or aneurysm. --Internal carotid artery: No dissection, occlusion or aneurysm. Mild  atherosclerotic calcification at the carotid bifurcation without hemodynamically significant stenosis. --External carotid artery: No acute abnormality. VERTEBRAL ARTERIES: Codominant configuration. Both origins are clearly patent. No dissection, occlusion or flow-limiting stenosis to the skull base (V1-V3 segments). CTA HEAD FINDINGS POSTERIOR CIRCULATION: --Vertebral arteries: Normal V4 segments. --Posterior inferior cerebellar arteries (PICA): Patent origins from the vertebral arteries. --Anterior inferior cerebellar arteries (AICA): Patent origins from the basilar artery. --Basilar artery: Normal. --Superior cerebellar arteries: Normal. --Posterior cerebral arteries (PCA): Normal. The left PCA is partially supplied by a posterior communicating artery (p-comm). ANTERIOR CIRCULATION: --Intracranial internal carotid arteries: There is reconstitution of the right internal carotid artery at the proximal cavernous segment due to collateral flow. The left ICA is normal. --Anterior cerebral arteries (ACA): Normal. Both A1 segments are present. Patent anterior communicating artery (a-comm). --Middle cerebral arteries (MCA): There are early bilateral MCA bifurcations. The right MCA is occluded at the proximal M2 segment. There is intermediate collateralization. The left MCA is normal. VENOUS SINUSES: As permitted by contrast timing, patent. ANATOMIC VARIANTS: None Review of the MIP images confirms the above findings. CT Brain Perfusion Findings: ASPECTS: 10 CBF (<30%) Volume: 5mL Perfusion (Tmax>6.0s) volume: 105mL Mismatch Volume: 100mL Infarction Location:Right MCA territory IMPRESSION: 1. 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 2. 100 mL ischemic penumbra within the right MCA territory. ASPECTS 10. 3. 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 Critical Value/emergent results were called by telephone at the time of interpretation on 01/13/2019 at 12:22 am to Dr. Caryl PinaERIC LINDZEN , who verbally acknowledged these results. Electronically Signed   By: Deatra RobinsonKevin  Herman M.D.   On: 01/13/2019 00:34   Ct Code Stroke Cta Neck W/wo Contrast  Result Date: 01/13/2019 CLINICAL DATA:  Left-sided weakness and facial droop EXAM: CT ANGIOGRAPHY HEAD AND NECK CT PERFUSION BRAIN TECHNIQUE: Multidetector CT imaging of the head and neck was performed using the standard protocol during bolus administration of intravenous contrast. Multiplanar CT image reconstructions and MIPs were obtained to evaluate the vascular anatomy. Carotid stenosis measurements (when applicable) are obtained utilizing NASCET criteria, using the distal internal carotid diameter as the denominator. Multiphase CT imaging of the brain was performed following IV bolus contrast injection. Subsequent parametric perfusion maps were calculated using RAPID software. CONTRAST:  125mL OMNIPAQUE IOHEXOL 350 MG/ML SOLN COMPARISON:  Head CT same day FINDINGS: CTA NECK FINDINGS SKELETON: There is no bony spinal canal stenosis. No lytic or blastic lesion. OTHER NECK: Normal pharynx, larynx and major salivary glands. No cervical lymphadenopathy. Unremarkable thyroid gland. UPPER CHEST: No pneumothorax or pleural effusion. No nodules or masses. AORTIC ARCH: There is no calcific atherosclerosis of the aortic arch. There is no aneurysm, dissection or hemodynamically significant stenosis of the visualized ascending aorta and aortic arch. Conventional 3 vessel aortic branching pattern. The visualized proximal subclavian arteries are widely patent. RIGHT CAROTID SYSTEM: --Common carotid artery: Widely patent origin without common carotid artery dissection or aneurysm. --Internal carotid artery: The right ICA is occluded at its origin. --External carotid artery: No acute abnormality. LEFT CAROTID  SYSTEM: --Common carotid artery: Widely patent origin without common carotid artery dissection or aneurysm. --Internal carotid artery: No dissection, occlusion or aneurysm. Mild atherosclerotic calcification at the carotid bifurcation without hemodynamically significant stenosis. --External carotid artery: No acute abnormality. VERTEBRAL ARTERIES: Codominant configuration. Both origins are clearly patent. No dissection, occlusion or flow-limiting stenosis to the skull base (V1-V3 segments). CTA HEAD FINDINGS POSTERIOR CIRCULATION: --Vertebral arteries: Normal V4 segments. --Posterior inferior cerebellar arteries (PICA): Patent origins from the vertebral arteries. --Anterior inferior cerebellar arteries (AICA): Patent origins from the basilar artery. --Basilar artery: Normal. --Superior cerebellar arteries: Normal. --Posterior cerebral arteries (PCA): Normal. The left PCA is partially supplied by a posterior communicating artery (p-comm). ANTERIOR CIRCULATION: --Intracranial internal carotid arteries: There is reconstitution of the right internal carotid artery at the proximal cavernous segment due to collateral flow. The left ICA is normal. --Anterior cerebral arteries (ACA): Normal. Both A1 segments are present. Patent anterior communicating artery (a-comm). --Middle cerebral arteries (MCA): There are early bilateral MCA bifurcations. The right MCA is occluded at the proximal M2 segment. There is intermediate collateralization. The left MCA is normal. VENOUS SINUSES: As permitted by contrast timing, patent. ANATOMIC VARIANTS: None Review of the MIP images confirms the above findings. CT Brain Perfusion Findings: ASPECTS: 10 CBF (<30%) Volume: 86mL Perfusion (Tmax>6.0s) volume: 177mL Mismatch Volume: 159mL Infarction Location:Right MCA territory IMPRESSION: 1. Emergent large vessel occlusion of the right middle cerebral artery proximal M2 segment with age-indeterminate occlusion of the entire length of the cervical  portion of the right internal carotid artery. 2. 100 mL ischemic penumbra within the right MCA territory. ASPECTS 10. 3. 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 Critical Value/emergent results were called by telephone at the time of interpretation on 01/13/2019 at 12:22 am to Dr. Kerney Elbe , who verbally acknowledged these results. Electronically Signed   By: Ulyses Jarred M.D.   On: 01/13/2019 00:34   Ct Code Stroke Cta Cerebral Perfusion W/wo Contrast  Result Date: 01/13/2019 CLINICAL DATA:  Left-sided weakness and facial droop EXAM: CT ANGIOGRAPHY HEAD AND NECK CT PERFUSION BRAIN TECHNIQUE: Multidetector CT imaging of the head and neck was performed using the standard protocol during bolus administration of intravenous contrast. Multiplanar CT image reconstructions and MIPs were obtained to evaluate the vascular anatomy. Carotid stenosis measurements (when applicable) are obtained utilizing NASCET criteria, using the distal internal carotid diameter as the denominator. Multiphase CT imaging of the brain was performed following IV bolus contrast injection. Subsequent parametric perfusion maps were calculated using RAPID software. CONTRAST:  156mL OMNIPAQUE IOHEXOL 350 MG/ML SOLN COMPARISON:  Head CT same day FINDINGS: CTA NECK FINDINGS SKELETON: There is no bony spinal canal stenosis. No lytic or blastic lesion. OTHER NECK: Normal pharynx, larynx and major salivary glands. No cervical lymphadenopathy. Unremarkable thyroid gland. UPPER CHEST: No pneumothorax or pleural effusion. No nodules or masses. AORTIC ARCH: There is no calcific atherosclerosis of the aortic arch. There is no aneurysm, dissection or hemodynamically significant stenosis of the visualized ascending aorta and aortic arch. Conventional 3 vessel aortic branching pattern. The visualized proximal subclavian arteries are widely patent. RIGHT CAROTID SYSTEM: --Common carotid artery: Widely patent  origin without common carotid artery dissection or aneurysm. --Internal carotid artery: The right ICA is occluded at its origin. --External carotid artery: No acute abnormality. LEFT CAROTID SYSTEM: --Common carotid artery: Widely patent origin without common carotid artery dissection or aneurysm. --Internal carotid artery: No dissection, occlusion or aneurysm. Mild atherosclerotic calcification at the carotid bifurcation  without hemodynamically significant stenosis. --External carotid artery: No acute abnormality. VERTEBRAL ARTERIES: Codominant configuration. Both origins are clearly patent. No dissection, occlusion or flow-limiting stenosis to the skull base (V1-V3 segments). CTA HEAD FINDINGS POSTERIOR CIRCULATION: --Vertebral arteries: Normal V4 segments. --Posterior inferior cerebellar arteries (PICA): Patent origins from the vertebral arteries. --Anterior inferior cerebellar arteries (AICA): Patent origins from the basilar artery. --Basilar artery: Normal. --Superior cerebellar arteries: Normal. --Posterior cerebral arteries (PCA): Normal. The left PCA is partially supplied by a posterior communicating artery (p-comm). ANTERIOR CIRCULATION: --Intracranial internal carotid arteries: There is reconstitution of the right internal carotid artery at the proximal cavernous segment due to collateral flow. The left ICA is normal. --Anterior cerebral arteries (ACA): Normal. Both A1 segments are present. Patent anterior communicating artery (a-comm). --Middle cerebral arteries (MCA): There are early bilateral MCA bifurcations. The right MCA is occluded at the proximal M2 segment. There is intermediate collateralization. The left MCA is normal. VENOUS SINUSES: As permitted by contrast timing, patent. ANATOMIC VARIANTS: None Review of the MIP images confirms the above findings. CT Brain Perfusion Findings: ASPECTS: 10 CBF (<30%) Volume: 5mL Perfusion (Tmax>6.0s) volume: Mismatch Volume: Infarction  Location:Right MCA territory IMPRESSION: 1. 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 2. 100 mL ischemic penumbra within the right MCA territory. ASPECTS 10. 3. 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 Critical Value/emergent results were called by telephone at the time of interpretation on 01/13/2019 at 12:22 am to Dr. Caryl Pina , who verbally acknowledged these results. Electronically Signed   By: Deatra Robinson M.D.   On: 01/13/2019 00:34   Ct Head Code Stroke Wo Contrast  Result Date: 01/13/2019 CLINICAL DATA:  Code stroke.  Left-sided weakness and facial droop EXAM: CT HEAD WITHOUT CONTRAST TECHNIQUE: Contiguous axial images were obtained from the base of the skull through the vertex without intravenous contrast. COMPARISON:  None. FINDINGS: Brain: There is a mass of the right middle cranial fossa that measures approximately 3.0 x 2.5 cm. There is widening of the right carotid canal and jugular foramen. Small region of hypoattenuation in the right frontal lobe, within the ACA territory, likely chronic. No acute hemorrhage or other extra-axial collection. Vascular: No abnormal hyperdensity of the major intracranial arteries or dural venous sinuses. No intracranial atherosclerosis. Skull: No skull fracture.  Skull base changes as above. Sinuses/Orbits: No fluid levels or advanced mucosal thickening of the visualized paranasal sinuses. No mastoid or middle ear effusion. The orbits are normal. ASPECTS [facility] Stroke Program Early CT Score) - Ganglionic level infarction (caudate, lentiform nuclei, internal capsule, insula, M1-M3 cortex): 7 - Supraganglionic infarction (M4-M6 cortex): 3 Total score (0-10 with 10 being normal): 10 IMPRESSION: 1. No intracranial hemorrhage. 2. ASPECTS is 10. 3. Right middle  cranial fossa mass with extension through the skull base. Differential considerations include cranial nerve schwannoma, glomus jugulare paraganglioma or aneurysm arising from the skull base segments of the right internal carotid artery. MRI with and without contrast recommended for further characterization. These results were communicated to Dr. Caryl Pina at 12:05 am on 01/13/2019 by text page via the [facility] messaging system. Electronically Signed   By: Deatra Robinson M.D.   On: 01/13/2019 00:05   Vas [place] Lower Extremity Venous (dvt)  Result Date: 01/13/2019  Lower Venous Study Indications: Stroke.  Risk Factors: None identified. Comparison Study: No  prior studies. Performing Technologist: Chanda BusingGregory Collins RVT  Examination Guidelines: A complete evaluation includes B-mode imaging, spectral Doppler, color Doppler, and power Doppler as needed of all accessible portions of each vessel. Bilateral testing is considered an integral part of a complete examination. Limited examinations for reoccurring indications may be performed as noted.  +---------+---------------+---------+-----------+----------+-------+  RIGHT     Compressibility Phasicity Spontaneity Properties Summary  +---------+---------------+---------+-----------+----------+-------+  CFV       Full            Yes       Yes                             +---------+---------------+---------+-----------+----------+-------+  SFJ       Full                                                      +---------+---------------+---------+-----------+----------+-------+  FV Prox   Full                                                      +---------+---------------+---------+-----------+----------+-------+  FV Mid    Full                                                      +---------+---------------+---------+-----------+----------+-------+  FV Distal Full                                                      +---------+---------------+---------+-----------+----------+-------+   PFV       Full                                                      +---------+---------------+---------+-----------+----------+-------+  POP       Full            Yes       Yes                             +---------+---------------+---------+-----------+----------+-------+  PTV       Full                                                      +---------+---------------+---------+-----------+----------+-------+  PERO      Full                                                      +---------+---------------+---------+-----------+----------+-------+   +---------+---------------+---------+-----------+----------+-------+  LEFT      Compressibility Phasicity Spontaneity Properties Summary  +---------+---------------+---------+-----------+----------+-------+  CFV       Full            Yes       Yes                             +---------+---------------+---------+-----------+----------+-------+  SFJ       Full                                                      +---------+---------------+---------+-----------+----------+-------+  FV Prox   Full                                                      +---------+---------------+---------+-----------+----------+-------+  FV Mid    Full                                                      +---------+---------------+---------+-----------+----------+-------+  FV Distal Full                                                      +---------+---------------+---------+-----------+----------+-------+  PFV       Full                                                      +---------+---------------+---------+-----------+----------+-------+  POP       Full            Yes       Yes                             +---------+---------------+---------+-----------+----------+-------+  PTV       Full                                                      +---------+---------------+---------+-----------+----------+-------+  PERO      Full                                                       +---------+---------------+---------+-----------+----------+-------+     Summary: Right: There is no evidence of deep vein thrombosis in the lower extremity. No cystic structure found in the popliteal fossa. Left: There is no evidence of deep vein thrombosis in the lower extremity. No cystic structure  found in the popliteal fossa.  *See table(s) above for measurements and observations.    Preliminary     Labs:  CBC: Recent Labs    01/12/19 2340 01/12/19 2347 01/13/19 1018  WBC 9.3  --  12.7*  HGB 13.9 13.9 11.3*  HCT 43.1 41.0 34.5*  PLT 196  --  181    COAGS: Recent Labs    01/12/19 2340  INR 1.0  APTT 25    BMP: Recent Labs    01/12/19 2340 01/12/19 2347 01/13/19 1018  NA 140 141 138  K 4.1 4.1 3.7  CL 109 110 111  CO2 22  --  19*  GLUCOSE 111* 110* 110*  BUN CALCIUM 9.4  --  8.0*  CREATININE 1.21 1.20 1.10  GFRNONAA >60  --  >60  GFRAA >60  --  >60    LIVER FUNCTION TESTS: Recent Labs    01/12/19 2340  BILITOT 0.7  AST 26  ALT 27  ALKPHOS 43  PROT 6.9  ALBUMIN 4.1    Assessment and Plan:  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 Corliss Skains. 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 Corliss Skains. Patient's condition stable- can spontaneously move all extremities. Right and left groin incisions stable. Continue taking Brilinta 90 mg twice daily (of note, only 1 dose today later this evening, start BID tomorrow) and Aspirin 81 mg once daily. Plan to follow-up with Dr. Corliss Skains in clinic 4 weeks after discharge. Further plans per neurology- appreciate and agree with management. Please call [facility] with questions/concerns.   Electronically Signed: Elwin Mocha, PA-C 01/13/2019, 1:18 PM   I spent a total of 25 Minutes at the the patient's bedside AND on the patient's hospital floor or unit, greater than 50% of which was  counseling/coordinating care for right MCA M1 segment occlusion and right ICA proximal occlusion s/p revascularization.

## 2019-01-13 NOTE — Progress Notes (Signed)
Patient ID: Alex Heath, male   DOB: Nov 13, 1955, 63 y.o.   MRN: [id number] INR Post treatment CT brain shows no ICH or mass effect. Ring enhancing mass seen in the RT middle cranial fossa. RT groin sheath replaced with an 74F angioseal device for hemostasis. LT groin sheath to arterial flush for BP monitoring. Distal pulses in both feet palpable ,unchanged. Extubated and moving all 4s spontaneously. S.Daqwan Dougal MD

## 2019-01-13 NOTE — ED Notes (Signed)
ED TO INPATIENT HANDOFF REPORT  ED Nurse Name and Phone #:  XBJYN [name]  S Name/Age/Gender Alex Heath 63 y.o. male Room/Bed: 024C/024C  Code Status   Code Status: Full Code  Home/SNF/Other Home Patient oriented to: self, place, time and situation Is this baseline? Yes   Triage Complete: Triage complete  Chief Complaint Code Stroke   Triage Note Per [facility], pt from home w/ a c/o code stroke. Pt LNW at 2100. He went to bed at that time and then woke up to use the bathroom. At that time he had an abnormal gait, left sided facial droop, and slurred speech. No blood thinners. No arm drift or gaze. No covid s/s.  12 lead NSR w/ PACs 158/80 HR 84 CBG 80  Bilateral 18 ga IVs   Allergies Allergies  Allergen Reactions  . Penicillins Other (See Comments)    childhood    Level of Care/Admitting Diagnosis ED Disposition    ED Disposition Condition Comment   Admit  [facility] [[id number]]  Level of Care: ICU [6]  Covid Evaluation: Asymptomatic Screening Protocol (No Symptoms)  Diagnosis: Stroke (cerebrum) [facility](HCC) [[id number]]) [[id number]]  Admitting Physician: Otelia LimesLINDZEN, ERIC Valen.Docker[[id number]]  Attending Physician: Otelia LimesLINDZEN, ERIC Valen.Docker[[id number]]  Estimated length of stay: 5 - 7 days  Certification:: I certify this patient will need inpatient services for at least 2 midnights  PT Class (Do Not Modify): Inpatient [101]  PT Acc Code (Do Not Modify): Private [1]       B Medical/Surgery History Past Medical History:  Diagnosis Date  . Hyperlipemia    No past surgical history on file.   A IV Location/Drains/Wounds Patient Lines/Drains/Airways Status   Active Line/Drains/Airways    Name:   Placement date:   Placement time:   Site:   Days:   Peripheral IV 01/13/19 Right Antecubital   01/13/19    0019    Antecubital   less than 1   Urethral Catheter Courtney, RN Straight-tip 16 Fr.   01/13/19    0046    Straight-tip   less than 1          Intake/Output Last 24  hours No intake or output data in the 24 hours ending 01/13/19 0120  Labs/Imaging Results for orders placed or performed during the hospital encounter of 01/12/19 (from the past 48 hour(s))  Protime-INR     Status: None   Collection Time: 01/12/19 11:40 PM  Result Value Ref Range   Prothrombin Time 12.8 11.4 - 15.2 seconds   INR 1.0 0.8 - 1.2    Comment: (NOTE) INR goal varies based on device and disease states. Performed at [facility], [address] [place], [place] [address]   APTT     Status: None   Collection Time: 01/12/19 11:40 PM  Result Value Ref Range   aPTT 25 24 - 36 seconds    Comment: Performed at [facility], [address] [place], [place] [address]  CBC     Status: None   Collection Time: 01/12/19 11:40 PM  Result Value Ref Range   WBC 9.3 4.0 - 10.5 K/uL   RBC 4.55 4.22 - 5.81 MIL/uL   Hemoglobin 13.9 13.0 - 17.0 g/dL   HCT 52.843.1 41.339.0 - 24.452.0 %   MCV 94.7 80.0 - 100.0 fL   MCH 30.5 26.0 - 34.0 pg   MCHC 32.3 30.0 - 36.0 g/dL   RDW 01.013.1 27.211.5 - 53.615.5 %   Platelets 196 150 -  400 K/uL   nRBC 0.0 0.0 - 0.2 %    Comment: Performed at [facility], [address] [place], [place] [address]  Differential     Status: Abnormal   Collection Time: 01/12/19 11:40 PM  Result Value Ref Range   Neutrophils Relative % 48 %   Neutro Abs 4.5 1.7 - 7.7 K/uL   Lymphocytes Relative 37 %   Lymphs Abs 3.5 0.7 - 4.0 K/uL   Monocytes Relative 12 %   Monocytes Absolute 1.1 (H) 0.1 - 1.0 K/uL   Eosinophils Relative 2 %   Eosinophils Absolute 0.2 0.0 - 0.5 K/uL   Basophils Relative 1 %   Basophils Absolute 0.1 0.0 - 0.1 K/uL   Immature Granulocytes 0 %   Abs Immature Granulocytes 0.01 0.00 - 0.07 K/uL    Comment: Performed at [facility], [address] [place], [place] [address]  Comprehensive metabolic panel     Status: Abnormal   Collection Time: 01/12/19 11:40 PM  Result Value Ref Range   Sodium 140 135 - 145 mmol/L   Potassium 4.1 3.5 - 5.1  mmol/L   Chloride 109 98 - 111 mmol/L   CO2 22 22 - 32 mmol/L   Glucose, Bld 111 (H) 70 - 99 mg/dL   BUN 16 8 - 23 mg/dL   Creatinine, Ser 0.981.21 0.61 - 1.24 mg/dL   Calcium 9.4 8.9 - 11.910.3 mg/dL   Total Protein 6.9 6.5 - 8.1 g/dL   Albumin 4.1 3.5 - 5.0 g/dL   AST 26 15 - 41 U/L   ALT 27 0 - 44 U/L   Alkaline Phosphatase 43 38 - 126 U/L   Total Bilirubin 0.7 0.3 - 1.2 mg/dL   GFR calc non Af Amer >60 >60 mL/min   GFR calc Af Amer >60 >60 mL/min   Anion gap 9 5 - 15    Comment: Performed at [facility], [address] [place], [place] [address]  Dickie LaI-stat chem 8, ED     Status: Abnormal   Collection Time: 01/12/19 11:47 PM  Result Value Ref Range   Sodium 141 135 - 145 mmol/L   Potassium 4.1 3.5 - 5.1 mmol/L   Chloride 110 98 - 111 mmol/L   BUN 19 8 - 23 mg/dL   Creatinine, Ser 9.561.20 0.61 - 1.24 mg/dL   Glucose, Bld 213110 (H) 70 - 99 mg/dL   Calcium, Ion 0.861.07 (L) 1.15 - 1.40 mmol/L   TCO2 24 22 - 32 mmol/L   Hemoglobin 13.9 13.0 - 17.0 g/dL   HCT 57.841.0 46.939.0 - 62.952.0 %  CBG monitoring, ED     Status: None   Collection Time: 01/13/19 12:16 AM  Result Value Ref Range   Glucose-Capillary 98 70 - 99 mg/dL   Ct Code Stroke Cta Head W/wo Contrast  Result Date: 01/13/2019 CLINICAL DATA:  Left-sided weakness and facial droop EXAM: CT ANGIOGRAPHY HEAD AND NECK CT PERFUSION BRAIN TECHNIQUE: Multidetector CT imaging of the head and neck was performed using the standard protocol during bolus administration of intravenous contrast. Multiplanar CT image reconstructions and MIPs were obtained to evaluate the vascular anatomy. Carotid stenosis measurements (when applicable) are obtained utilizing NASCET criteria, using the distal internal carotid diameter as the denominator. Multiphase CT imaging of the brain was performed following IV bolus contrast injection. Subsequent parametric perfusion maps were calculated using RAPID software. CONTRAST:  125mL OMNIPAQUE IOHEXOL 350 MG/ML SOLN COMPARISON:   Head CT same day  FINDINGS: CTA NECK FINDINGS SKELETON: There is no bony spinal canal stenosis. No lytic or blastic lesion. OTHER NECK: Normal pharynx, larynx and major salivary glands. No cervical lymphadenopathy. Unremarkable thyroid gland. UPPER CHEST: No pneumothorax or pleural effusion. No nodules or masses. AORTIC ARCH: There is no calcific atherosclerosis of the aortic arch. There is no aneurysm, dissection or hemodynamically significant stenosis of the visualized ascending aorta and aortic arch. Conventional 3 vessel aortic branching pattern. The visualized proximal subclavian arteries are widely patent. RIGHT CAROTID SYSTEM: --Common carotid artery: Widely patent origin without common carotid artery dissection or aneurysm. --Internal carotid artery: The right ICA is occluded at its origin. --External carotid artery: No acute abnormality. LEFT CAROTID SYSTEM: --Common carotid artery: Widely patent origin without common carotid artery dissection or aneurysm. --Internal carotid artery: No dissection, occlusion or aneurysm. Mild atherosclerotic calcification at the carotid bifurcation without hemodynamically significant stenosis. --External carotid artery: No acute abnormality. VERTEBRAL ARTERIES: Codominant configuration. Both origins are clearly patent. No dissection, occlusion or flow-limiting stenosis to the skull base (V1-V3 segments). CTA HEAD FINDINGS POSTERIOR CIRCULATION: --Vertebral arteries: Normal V4 segments. --Posterior inferior cerebellar arteries (PICA): Patent origins from the vertebral arteries. --Anterior inferior cerebellar arteries (AICA): Patent origins from the basilar artery. --Basilar artery: Normal. --Superior cerebellar arteries: Normal. --Posterior cerebral arteries (PCA): Normal. The left PCA is partially supplied by a posterior communicating artery (p-comm). ANTERIOR CIRCULATION: --Intracranial internal carotid arteries: There is reconstitution of the right internal carotid artery  at the proximal cavernous segment due to collateral flow. The left ICA is normal. --Anterior cerebral arteries (ACA): Normal. Both A1 segments are present. Patent anterior communicating artery (a-comm). --Middle cerebral arteries (MCA): There are early bilateral MCA bifurcations. The right MCA is occluded at the proximal M2 segment. There is intermediate collateralization. The left MCA is normal. VENOUS SINUSES: As permitted by contrast timing, patent. ANATOMIC VARIANTS: None Review of the MIP images confirms the above findings. CT Brain Perfusion Findings: ASPECTS: 10 CBF (<30%) Volume: 5mL Perfusion (Tmax>6.0s) volume: 105mL Mismatch Volume: 100mL Infarction Location:Right MCA territory IMPRESSION: 1. 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 2. 100 mL ischemic penumbra within the right MCA territory. ASPECTS 10. 3. 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 Critical Value/emergent results were called by telephone at the time of interpretation on 01/13/2019 at 12:22 am to Dr. Caryl PinaERIC LINDZEN , who verbally acknowledged these results. Electronically Signed   By: Deatra RobinsonKevin  Herman M.D.   On: 01/13/2019 00:34   Ct Code Stroke Cta Neck W/wo Contrast  Result Date: 01/13/2019 CLINICAL DATA:  Left-sided weakness and facial droop EXAM: CT ANGIOGRAPHY HEAD AND NECK CT PERFUSION BRAIN TECHNIQUE: Multidetector CT imaging of the head and neck was performed using the standard protocol during bolus administration of intravenous contrast. Multiplanar CT image reconstructions and MIPs were obtained to evaluate the vascular anatomy. Carotid stenosis measurements (when applicable) are obtained utilizing NASCET criteria, using the distal internal carotid diameter as the denominator. Multiphase CT imaging of the brain was performed  following IV bolus contrast injection. Subsequent parametric perfusion maps were calculated using RAPID software. CONTRAST:  125mL OMNIPAQUE IOHEXOL 350 MG/ML SOLN COMPARISON:  Head CT same day FINDINGS: CTA NECK FINDINGS SKELETON: There is no bony spinal canal stenosis. No lytic or blastic lesion. OTHER NECK: Normal pharynx, larynx and major salivary glands. No cervical lymphadenopathy. Unremarkable  thyroid gland. UPPER CHEST: No pneumothorax or pleural effusion. No nodules or masses. AORTIC ARCH: There is no calcific atherosclerosis of the aortic arch. There is no aneurysm, dissection or hemodynamically significant stenosis of the visualized ascending aorta and aortic arch. Conventional 3 vessel aortic branching pattern. The visualized proximal subclavian arteries are widely patent. RIGHT CAROTID SYSTEM: --Common carotid artery: Widely patent origin without common carotid artery dissection or aneurysm. --Internal carotid artery: The right ICA is occluded at its origin. --External carotid artery: No acute abnormality. LEFT CAROTID SYSTEM: --Common carotid artery: Widely patent origin without common carotid artery dissection or aneurysm. --Internal carotid artery: No dissection, occlusion or aneurysm. Mild atherosclerotic calcification at the carotid bifurcation without hemodynamically significant stenosis. --External carotid artery: No acute abnormality. VERTEBRAL ARTERIES: Codominant configuration. Both origins are clearly patent. No dissection, occlusion or flow-limiting stenosis to the skull base (V1-V3 segments). CTA HEAD FINDINGS POSTERIOR CIRCULATION: --Vertebral arteries: Normal V4 segments. --Posterior inferior cerebellar arteries (PICA): Patent origins from the vertebral arteries. --Anterior inferior cerebellar arteries (AICA): Patent origins from the basilar artery. --Basilar artery: Normal. --Superior cerebellar arteries: Normal. --Posterior cerebral arteries (PCA): Normal. The left PCA is partially  supplied by a posterior communicating artery (p-comm). ANTERIOR CIRCULATION: --Intracranial internal carotid arteries: There is reconstitution of the right internal carotid artery at the proximal cavernous segment due to collateral flow. The left ICA is normal. --Anterior cerebral arteries (ACA): Normal. Both A1 segments are present. Patent anterior communicating artery (a-comm). --Middle cerebral arteries (MCA): There are early bilateral MCA bifurcations. The right MCA is occluded at the proximal M2 segment. There is intermediate collateralization. The left MCA is normal. VENOUS SINUSES: As permitted by contrast timing, patent. ANATOMIC VARIANTS: None Review of the MIP images confirms the above findings. CT Brain Perfusion Findings: ASPECTS: 10 CBF (<30%) Volume: 54mL Perfusion (Tmax>6.0s) volume: 138mL Mismatch Volume: 129mL Infarction Location:Right MCA territory IMPRESSION: 1. 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 2. 100 mL ischemic penumbra within the right MCA territory. ASPECTS 10. 3. 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 Critical Value/emergent results were called by telephone at the time of interpretation on 01/13/2019 at 12:22 am to Dr. Kerney Elbe , who verbally acknowledged these results. Electronically Signed   By: Ulyses Jarred M.D.   On: 01/13/2019 00:34   Ct Code Stroke Cta Cerebral Perfusion W/wo Contrast  Result Date: 01/13/2019 CLINICAL DATA:  Left-sided weakness and facial droop EXAM: CT ANGIOGRAPHY HEAD AND NECK CT PERFUSION BRAIN TECHNIQUE: Multidetector CT imaging of the head and neck was performed using the standard protocol during bolus administration of intravenous contrast. Multiplanar CT image reconstructions and MIPs were obtained to evaluate the vascular anatomy. Carotid  stenosis measurements (when applicable) are obtained utilizing NASCET criteria, using the distal internal carotid diameter as the denominator. Multiphase CT imaging of the brain was performed following IV bolus contrast injection. Subsequent parametric perfusion maps were calculated using RAPID software. CONTRAST:  123mL OMNIPAQUE IOHEXOL 350 MG/ML SOLN COMPARISON:  Head CT same day FINDINGS: CTA NECK FINDINGS SKELETON: There is no bony spinal canal stenosis. No lytic or blastic lesion. OTHER NECK: Normal pharynx, larynx and major salivary glands. No cervical lymphadenopathy. Unremarkable thyroid gland. UPPER CHEST: No pneumothorax or pleural effusion. No nodules or masses. AORTIC ARCH: There is no calcific atherosclerosis of the aortic arch. There is no aneurysm, dissection  or hemodynamically significant stenosis of the visualized ascending aorta and aortic arch. Conventional 3 vessel aortic branching pattern. The visualized proximal subclavian arteries are widely patent. RIGHT CAROTID SYSTEM: --Common carotid artery: Widely patent origin without common carotid artery dissection or aneurysm. --Internal carotid artery: The right ICA is occluded at its origin. --External carotid artery: No acute abnormality. LEFT CAROTID SYSTEM: --Common carotid artery: Widely patent origin without common carotid artery dissection or aneurysm. --Internal carotid artery: No dissection, occlusion or aneurysm. Mild atherosclerotic calcification at the carotid bifurcation without hemodynamically significant stenosis. --External carotid artery: No acute abnormality. VERTEBRAL ARTERIES: Codominant configuration. Both origins are clearly patent. No dissection, occlusion or flow-limiting stenosis to the skull base (V1-V3 segments). CTA HEAD FINDINGS POSTERIOR CIRCULATION: --Vertebral arteries: Normal V4 segments. --Posterior inferior cerebellar arteries (PICA): Patent origins from the vertebral arteries. --Anterior inferior cerebellar  arteries (AICA): Patent origins from the basilar artery. --Basilar artery: Normal. --Superior cerebellar arteries: Normal. --Posterior cerebral arteries (PCA): Normal. The left PCA is partially supplied by a posterior communicating artery (p-comm). ANTERIOR CIRCULATION: --Intracranial internal carotid arteries: There is reconstitution of the right internal carotid artery at the proximal cavernous segment due to collateral flow. The left ICA is normal. --Anterior cerebral arteries (ACA): Normal. Both A1 segments are present. Patent anterior communicating artery (a-comm). --Middle cerebral arteries (MCA): There are early bilateral MCA bifurcations. The right MCA is occluded at the proximal M2 segment. There is intermediate collateralization. The left MCA is normal. VENOUS SINUSES: As permitted by contrast timing, patent. ANATOMIC VARIANTS: None Review of the MIP images confirms the above findings. CT Brain Perfusion Findings: ASPECTS: 10 CBF (<30%) Volume: 5mL Perfusion (Tmax>6.0s) volume: Mismatch Volume: Infarction Location:Right MCA territory IMPRESSION: 1. 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 2. 100 mL ischemic penumbra within the right MCA territory. ASPECTS 10. 3. 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 Critical Value/emergent results were called by telephone at the time of interpretation on 01/13/2019 at 12:22 am to Dr. Caryl Pina , who verbally acknowledged these results. Electronically Signed   By: Deatra Robinson M.D.   On: 01/13/2019 00:34   Ct Head Code Stroke Wo Contrast  Result Date: 01/13/2019 CLINICAL DATA:  Code stroke.  Left-sided weakness and facial droop EXAM: CT HEAD WITHOUT CONTRAST TECHNIQUE: Contiguous axial images were obtained from the base of the skull through the  vertex without intravenous contrast. COMPARISON:  None. FINDINGS: Brain: There is a mass of the right middle cranial fossa that measures approximately 3.0 x 2.5 cm. There is widening of the right carotid canal and jugular foramen. Small region of hypoattenuation in the right frontal lobe, within the ACA territory, likely chronic. No acute hemorrhage or other extra-axial collection. Vascular: No abnormal hyperdensity of the major intracranial arteries or dural venous sinuses. No intracranial atherosclerosis. Skull: No skull fracture.  Skull base changes as above. Sinuses/Orbits: No fluid levels or advanced mucosal thickening of the visualized paranasal sinuses. No mastoid or middle ear effusion. The orbits are normal. ASPECTS [facility] Stroke Program Early CT Score) - Ganglionic level infarction (caudate, lentiform nuclei, internal capsule, insula, M1-M3 cortex): 7 - Supraganglionic infarction (M4-M6 cortex): 3 Total score (0-10 with 10 being normal): 10 IMPRESSION: 1. No intracranial hemorrhage. 2. ASPECTS is 10. 3. Right middle cranial fossa mass with extension through the skull base. Differential considerations include  cranial nerve schwannoma, glomus jugulare paraganglioma or aneurysm arising from the skull base segments of the right internal carotid artery. MRI with and without contrast recommended for further characterization. These results were communicated to Dr. Caryl Pina at 12:05 am on 01/13/2019 by text page via the [facility] messaging system. Electronically Signed   By: Deatra Robinson M.D.   On: 01/13/2019 00:05    Pending Labs Unresulted Labs (From admission, onward)    Start     Ordered   01/13/19 0500  Hemoglobin A1c  Tomorrow morning,   R     01/13/19 0057   01/13/19 0500  Lipid panel  Tomorrow morning,   R    Comments: Fasting    01/13/19 0057   01/13/19 0055  HIV antibody (Routine Testing)  Once,   STAT     01/13/19 0057   01/13/19 0032  SARS Coronavirus 2 (CEPHEID - Performed in [facility]  Health hospital lab), Hosp Order  (Asymptomatic Patients Labs)  Once,   STAT    Question:  Rule Out  Answer:  Yes   01/13/19 0031          Vitals/Pain Today's Vitals   01/13/19 0024 01/13/19 0030 01/13/19 0040 01/13/19 0045  BP:  (!) 154/85  140/79  Pulse:  86  79  Resp:  20  (!) 24  Temp:      TempSrc:      SpO2:  99%  98%  Weight: 75.5 kg     Height:    (1.676 m)   PainSc: 0-No pain       Isolation Precautions No active isolations  Medications Medications  sodium chloride flush (NS) 0.9 % injection 3 mL (has no administration in time range)   stroke: mapping our early stages of recovery book (has no administration in time range)  0.9 %  sodium chloride infusion (has no administration in time range)  acetaminophen (TYLENOL) tablet 650 mg (has no administration in time range)    Or  acetaminophen (TYLENOL) solution 650 mg (has no administration in time range)    Or  acetaminophen (TYLENOL) suppository 650 mg (has no administration in time range)  senna-docusate (Senokot-S) tablet 1 tablet (has no administration in time range)  iohexol (OMNIPAQUE) 350 MG/ML injection 125 mL (125 mLs Intravenous Contrast Given 01/13/19 0011)    Mobility walks Low fall risk   Focused Assessments    R Recommendations: See Admitting Provider Note  Report given to:   Additional Notes:  LVO; Currently being intubated. Going to IR.

## 2019-01-13 NOTE — ED Notes (Signed)
Patient transported to IR 

## 2019-01-13 NOTE — Progress Notes (Signed)
Pt wife dropped off ipad, phone and charger, and reading glasses. Placed at bedside.

## 2019-01-13 NOTE — ED Provider Notes (Signed)
[facility] EMERGENCY DEPARTMENT Provider Note   CSN: [id number] Arrival date & time: 01/12/19  2340  An emergency department physician performed an initial assessment on this suspected stroke patient at 2340.  History   Chief Complaint Chief Complaint  Patient presents with   Code Stroke    HPI Alex Heath is a 63 y.o. male.     HPI   63 year old male with history of hyperlipidemia presenting the emergency department today as a code stroke.  History obtained by EMS.  Patient reportedly had a long bike ride yesterday and rode about 30 miles in the hot sun.  Today he had been in his normal state of health and had gone to bed around 9 PM.  He woke up around 1050 and his wife noticed that he had garbled speech and left-sided facial droop.  She also did testing of his bilateral upper extremities and noticed that he had a left upper extremity drift.  She also noticed that he had an abnormal gait.  This was also noted by EMS on their arrival.  He denied any other symptoms including no headache, dizziness, blurred vision.  Level 5 caveat secondary to acuity of condition.  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      Home Medications    Prior to Admission medications   Medication Sig Start Date End Date Taking? Authorizing Provider  ondansetron (ZOFRAN ODT) 8 MG disintegrating tablet Take 1 tablet (8 mg total) by mouth every 8 (eight) hours as needed for nausea or vomiting. 10/26/13   Marisa Severin, MD  TURMERIC PO Take 3 tablets by mouth daily.    [provider]  Zinc 100 MG TABS Take 100 mg by mouth daily.    [provider]    Family History No family history on file.  Social History Social History   Tobacco Use   Smoking status: Never Smoker   Smokeless tobacco: Never Used    Substance Use Topics   Alcohol use: Yes    Comment: occassionally   Drug use: Never     Allergies   Penicillins   Review of Systems Review of Systems  Constitutional: Negative for fever.  HENT: Negative for sore throat.   Eyes: Negative for visual disturbance.  Respiratory: Negative for cough and shortness of breath.   Cardiovascular: Negative for chest pain.  Gastrointestinal: Negative for abdominal pain, constipation, diarrhea, nausea and vomiting.  Genitourinary: Negative for dysuria.  Musculoskeletal: Negative for back pain and neck pain.  Skin: Negative for rash.  Neurological: Positive for speech difficulty and weakness. Negative for dizziness, light-headedness and headaches.  All other systems reviewed and are negative.    Physical Exam Updated Vital Signs BP 93/60    Pulse 60    Temp 97.9 F (36.6 C)    Resp 18    Ht  (1.676 m)    Wt 75.5 kg    SpO2 100%    BMI 26.87 kg/m   Physical Exam Vitals signs and nursing note reviewed.  Constitutional:      Appearance: He is well-developed.  HENT:     Head: Normocephalic and atraumatic.  Eyes:     Conjunctiva/sclera: Conjunctivae normal.  Neck:     Musculoskeletal: Neck supple.  Cardiovascular:     Rate and Rhythm: Normal rate  and regular rhythm.     Heart sounds: No murmur.  Pulmonary:     Effort: Pulmonary effort is normal.     Breath sounds: Normal breath sounds.     Comments: Equal chest rise and fall Abdominal:     Palpations: Abdomen is soft.     Tenderness: There is no abdominal tenderness.  Skin:    General: Skin is warm and dry.  Neurological:     Mental Status: He is alert.     Sensory: No sensory deficit.     Deep Tendon Reflexes: Reflexes normal.     Comments: Alert, oriented, left-sided facial droop, garbled speech.  No aphasia.  Normal strength of the bilateral upper and lower extremity.  No dysmetria with finger-to-nose bilaterally and normal coordination with heel-to-shin bilaterally.      ED Treatments / Results  Labs (all labs ordered are listed, but only abnormal results are displayed) Labs Reviewed  DIFFERENTIAL - Abnormal; Notable for the following components:      Result Value   Monocytes Absolute 1.1 (*)    All other components within normal limits  COMPREHENSIVE METABOLIC PANEL - Abnormal; Notable for the following components:   Glucose, Bld 111 (*)    All other components within normal limits  HEMOGLOBIN A1C - Abnormal; Notable for the following components:   Hgb A1c MFr Bld 6.0 (*)    All other components within normal limits  I-STAT CHEM 8, ED - Abnormal; Notable for the following components:   Glucose, Bld 110 (*)    Calcium, Ion 1.07 (*)    All other components within normal limits  SARS CORONAVIRUS 2 (HOSPITAL ORDER, [place] [facility])  MRSA PCR SCREENING  PROTIME-INR  APTT  CBC  LIPID PANEL  HIV ANTIBODY (ROUTINE TESTING W REFLEX)  CBG MONITORING, ED    EKG EKG Interpretation  Date/Time:  Monday January 13 2019 00:15:11 EDT Ventricular Rate:  70 PR Interval:    QRS Duration: 103 QT Interval:  415 QTC Calculation: 448 R Axis:   76 Text Interpretation:  Sinus rhythm Ventricular premature complex No significant change since last tracing Confirmed by Ward, Cyril Mourning [phone]) on 01/13/2019 12:29:33 AM   Radiology Ct Code Stroke Cta Head W/wo Contrast  Result Date: 01/13/2019 CLINICAL DATA:  Left-sided weakness and facial droop EXAM: CT ANGIOGRAPHY HEAD AND NECK CT PERFUSION BRAIN TECHNIQUE: Multidetector CT imaging of the head and neck was performed using the standard protocol during bolus administration of intravenous contrast. Multiplanar CT image reconstructions and MIPs were obtained to evaluate the vascular anatomy. Carotid stenosis measurements (when applicable) are obtained utilizing NASCET criteria, using the distal internal carotid diameter as the denominator. Multiphase CT imaging of the brain was performed following  IV bolus contrast injection. Subsequent parametric perfusion maps were calculated using RAPID software. CONTRAST:  156mL OMNIPAQUE IOHEXOL 350 MG/ML SOLN COMPARISON:  Head CT same day FINDINGS: CTA NECK FINDINGS SKELETON: There is no bony spinal canal stenosis. No lytic or blastic lesion. OTHER NECK: Normal pharynx, larynx and major salivary glands. No cervical lymphadenopathy. Unremarkable thyroid gland. UPPER CHEST: No pneumothorax or pleural effusion. No nodules or masses. AORTIC ARCH: There is no calcific atherosclerosis of the aortic arch. There is no aneurysm, dissection or hemodynamically significant stenosis of the visualized ascending aorta and aortic arch. Conventional 3 vessel aortic branching pattern. The visualized proximal subclavian arteries are widely patent. RIGHT CAROTID SYSTEM: --Common carotid artery: Widely patent origin without common carotid artery dissection or aneurysm. --Internal carotid  artery: The right ICA is occluded at its origin. --External carotid artery: No acute abnormality. LEFT CAROTID SYSTEM: --Common carotid artery: Widely patent origin without common carotid artery dissection or aneurysm. --Internal carotid artery: No dissection, occlusion or aneurysm. Mild atherosclerotic calcification at the carotid bifurcation without hemodynamically significant stenosis. --External carotid artery: No acute abnormality. VERTEBRAL ARTERIES: Codominant configuration. Both origins are clearly patent. No dissection, occlusion or flow-limiting stenosis to the skull base (V1-V3 segments). CTA HEAD FINDINGS POSTERIOR CIRCULATION: --Vertebral arteries: Normal V4 segments. --Posterior inferior cerebellar arteries (PICA): Patent origins from the vertebral arteries. --Anterior inferior cerebellar arteries (AICA): Patent origins from the basilar artery. --Basilar artery: Normal. --Superior cerebellar arteries: Normal. --Posterior cerebral arteries (PCA): Normal. The left PCA is partially supplied by a  posterior communicating artery (p-comm). ANTERIOR CIRCULATION: --Intracranial internal carotid arteries: There is reconstitution of the right internal carotid artery at the proximal cavernous segment due to collateral flow. The left ICA is normal. --Anterior cerebral arteries (ACA): Normal. Both A1 segments are present. Patent anterior communicating artery (a-comm). --Middle cerebral arteries (MCA): There are early bilateral MCA bifurcations. The right MCA is occluded at the proximal M2 segment. There is intermediate collateralization. The left MCA is normal. VENOUS SINUSES: As permitted by contrast timing, patent. ANATOMIC VARIANTS: None Review of the MIP images confirms the above findings. CT Brain Perfusion Findings: ASPECTS: 10 CBF (<30%) Volume: 5mL Perfusion (Tmax>6.0s) volume: 105mL Mismatch Volume: 100mL Infarction Location:Right MCA territory IMPRESSION: 1. 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 2. 100 mL ischemic penumbra within the right MCA territory. ASPECTS 10. 3. 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 Critical Value/emergent results were called by telephone at the time of interpretation on 01/13/2019 at 12:22 am to Dr. Caryl PinaERIC LINDZEN , who verbally acknowledged these results. Electronically Signed   By: Deatra RobinsonKevin  Herman M.D.   On: 01/13/2019 00:34   Ct Code Stroke Cta Neck W/wo Contrast  Result Date: 01/13/2019 CLINICAL DATA:  Left-sided weakness and facial droop EXAM: CT ANGIOGRAPHY HEAD AND NECK CT PERFUSION BRAIN TECHNIQUE: Multidetector CT imaging of the head and neck was performed using the standard protocol during bolus administration of intravenous contrast. Multiplanar CT image reconstructions and MIPs were obtained to evaluate the vascular anatomy. Carotid stenosis measurements (when  applicable) are obtained utilizing NASCET criteria, using the distal internal carotid diameter as the denominator. Multiphase CT imaging of the brain was performed following IV bolus contrast injection. Subsequent parametric perfusion maps were calculated using RAPID software. CONTRAST:  125mL OMNIPAQUE IOHEXOL 350 MG/ML SOLN COMPARISON:  Head CT same day FINDINGS: CTA NECK FINDINGS SKELETON: There is no bony spinal canal stenosis. No lytic or blastic lesion. OTHER NECK: Normal pharynx, larynx and major salivary glands. No cervical lymphadenopathy. Unremarkable thyroid gland. UPPER CHEST: No pneumothorax or pleural effusion. No nodules or masses. AORTIC ARCH: There is no calcific atherosclerosis of the aortic arch. There is no aneurysm, dissection or hemodynamically significant stenosis of the visualized ascending aorta and aortic arch. Conventional 3 vessel aortic branching pattern. The visualized proximal subclavian arteries are widely patent. RIGHT CAROTID SYSTEM: --Common carotid artery: Widely patent origin without common carotid artery dissection or aneurysm. --Internal carotid artery: The right ICA is occluded at its origin. --External carotid artery: No acute abnormality. LEFT CAROTID SYSTEM: --Common carotid artery: Widely patent origin without common carotid artery dissection or  aneurysm. --Internal carotid artery: No dissection, occlusion or aneurysm. Mild atherosclerotic calcification at the carotid bifurcation without hemodynamically significant stenosis. --External carotid artery: No acute abnormality. VERTEBRAL ARTERIES: Codominant configuration. Both origins are clearly patent. No dissection, occlusion or flow-limiting stenosis to the skull base (V1-V3 segments). CTA HEAD FINDINGS POSTERIOR CIRCULATION: --Vertebral arteries: Normal V4 segments. --Posterior inferior cerebellar arteries (PICA): Patent origins from the vertebral arteries. --Anterior inferior cerebellar arteries (AICA): Patent origins  from the basilar artery. --Basilar artery: Normal. --Superior cerebellar arteries: Normal. --Posterior cerebral arteries (PCA): Normal. The left PCA is partially supplied by a posterior communicating artery (p-comm). ANTERIOR CIRCULATION: --Intracranial internal carotid arteries: There is reconstitution of the right internal carotid artery at the proximal cavernous segment due to collateral flow. The left ICA is normal. --Anterior cerebral arteries (ACA): Normal. Both A1 segments are present. Patent anterior communicating artery (a-comm). --Middle cerebral arteries (MCA): There are early bilateral MCA bifurcations. The right MCA is occluded at the proximal M2 segment. There is intermediate collateralization. The left MCA is normal. VENOUS SINUSES: As permitted by contrast timing, patent. ANATOMIC VARIANTS: None Review of the MIP images confirms the above findings. CT Brain Perfusion Findings: ASPECTS: 10 CBF (<30%) Volume: 5mL Perfusion (Tmax>6.0s) volume: 105mL Mismatch Volume: 100mL Infarction Location:Right MCA territory IMPRESSION: 1. 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 2. 100 mL ischemic penumbra within the right MCA territory. ASPECTS 10. 3. 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 Critical Value/emergent results were called by telephone at the time of interpretation on 01/13/2019 at 12:22 am to Dr. Caryl PinaERIC LINDZEN , who verbally acknowledged these results. Electronically Signed   By: Deatra RobinsonKevin  Herman M.D.   On: 01/13/2019 00:34   Ct Code Stroke Cta Cerebral Perfusion W/wo Contrast  Result Date: 01/13/2019 CLINICAL DATA:  Left-sided weakness and facial droop EXAM: CT ANGIOGRAPHY HEAD AND NECK CT PERFUSION BRAIN TECHNIQUE: Multidetector CT imaging of the head and neck was performed using the standard  protocol during bolus administration of intravenous contrast. Multiplanar CT image reconstructions and MIPs were obtained to evaluate the vascular anatomy. Carotid stenosis measurements (when applicable) are obtained utilizing NASCET criteria, using the distal internal carotid diameter as the denominator. Multiphase CT imaging of the brain was performed following IV bolus contrast injection. Subsequent parametric perfusion maps were calculated using RAPID software. CONTRAST:  125mL OMNIPAQUE IOHEXOL 350 MG/ML SOLN COMPARISON:  Head CT same day FINDINGS: CTA NECK FINDINGS SKELETON: There is no bony spinal canal stenosis. No lytic or blastic lesion. OTHER NECK: Normal pharynx, larynx and major salivary glands. No cervical lymphadenopathy. Unremarkable thyroid gland. UPPER CHEST: No pneumothorax or pleural effusion. No nodules or masses. AORTIC ARCH: There is no calcific atherosclerosis of the aortic arch. There is no aneurysm, dissection or hemodynamically significant stenosis of the visualized ascending aorta and aortic arch. Conventional 3 vessel aortic branching pattern. The visualized proximal subclavian arteries are widely patent. RIGHT CAROTID SYSTEM: --Common carotid artery: Widely patent origin without common carotid artery dissection or aneurysm. --Internal carotid artery: The right ICA is occluded at its origin. --External carotid artery: No acute abnormality. LEFT CAROTID SYSTEM: --Common carotid artery: Widely patent origin without common carotid artery dissection or aneurysm. --Internal carotid artery: No dissection, occlusion or aneurysm. Mild atherosclerotic calcification at the carotid bifurcation without hemodynamically significant stenosis. --External carotid artery: No acute abnormality. VERTEBRAL ARTERIES: Codominant  configuration. Both origins are clearly patent. No dissection, occlusion or flow-limiting stenosis to the skull base (V1-V3 segments). CTA HEAD FINDINGS POSTERIOR CIRCULATION:  --Vertebral arteries: Normal V4 segments. --Posterior inferior cerebellar arteries (PICA): Patent origins from the vertebral arteries. --Anterior inferior cerebellar arteries (AICA): Patent origins from the basilar artery. --Basilar artery: Normal. --Superior cerebellar arteries: Normal. --Posterior cerebral arteries (PCA): Normal. The left PCA is partially supplied by a posterior communicating artery (p-comm). ANTERIOR CIRCULATION: --Intracranial internal carotid arteries: There is reconstitution of the right internal carotid artery at the proximal cavernous segment due to collateral flow. The left ICA is normal. --Anterior cerebral arteries (ACA): Normal. Both A1 segments are present. Patent anterior communicating artery (a-comm). --Middle cerebral arteries (MCA): There are early bilateral MCA bifurcations. The right MCA is occluded at the proximal M2 segment. There is intermediate collateralization. The left MCA is normal. VENOUS SINUSES: As permitted by contrast timing, patent. ANATOMIC VARIANTS: None Review of the MIP images confirms the above findings. CT Brain Perfusion Findings: ASPECTS: 10 CBF (<30%) Volume: 5mL Perfusion (Tmax>6.0s) volume: Mismatch Volume: Infarction Location:Right MCA territory IMPRESSION: 1. 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 2. 100 mL ischemic penumbra within the right MCA territory. ASPECTS 10. 3. 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 Critical Value/emergent results were called by telephone at the time of interpretation on 01/13/2019 at 12:22 am to Dr. Caryl Pina , who verbally acknowledged these results. Electronically Signed   By: Deatra Robinson M.D.   On: 01/13/2019 00:34   Ct Head Code Stroke Wo Contrast  Result Date: 01/13/2019 CLINICAL DATA:  Code  stroke.  Left-sided weakness and facial droop EXAM: CT HEAD WITHOUT CONTRAST TECHNIQUE: Contiguous axial images were obtained from the base of the skull through the vertex without intravenous contrast. COMPARISON:  None. FINDINGS: Brain: There is a mass of the right middle cranial fossa that measures approximately 3.0 x 2.5 cm. There is widening of the right carotid canal and jugular foramen. Small region of hypoattenuation in the right frontal lobe, within the ACA territory, likely chronic. No acute hemorrhage or other extra-axial collection. Vascular: No abnormal hyperdensity of the major intracranial arteries or dural venous sinuses. No intracranial atherosclerosis. Skull: No skull fracture.  Skull base changes as above. Sinuses/Orbits: No fluid levels or advanced mucosal thickening of the visualized paranasal sinuses. No mastoid or middle ear effusion. The orbits are normal. ASPECTS [facility] Stroke Program Early CT Score) - Ganglionic level infarction (caudate, lentiform nuclei, internal capsule, insula, M1-M3 cortex): 7 - Supraganglionic infarction (M4-M6 cortex): 3 Total score (0-10 with 10 being normal): 10 IMPRESSION: 1. No intracranial hemorrhage. 2. ASPECTS is 10. 3. Right middle cranial fossa mass with extension through the skull base. Differential considerations include cranial nerve schwannoma, glomus jugulare paraganglioma or aneurysm arising from the skull base segments of the right internal carotid artery. MRI with and without contrast recommended for further characterization. These results were communicated to Dr. Caryl Pina at 12:05 am on 01/13/2019 by text page via the [facility] messaging system. Electronically Signed   By: Deatra Robinson M.D.   On: 01/13/2019 00:05    Procedures Procedures (including critical care time) CRITICAL CARE Performed by: Karrie Meres   Total critical care time: 32 minutes  Critical care time was exclusive of separately billable procedures and treating other  patients.  Critical care was necessary to treat or prevent imminent or life-threatening deterioration.  Critical care was time spent personally by me on the following activities: development of treatment plan with patient and/or surrogate as well as nursing, discussions with consultants, evaluation of patient's response to treatment, examination of patient, obtaining history from patient or surrogate, ordering and performing treatments and interventions, ordering and review of laboratory studies, ordering and review of radiographic studies, pulse oximetry and re-evaluation of patient's condition.   Medications Ordered in ED Medications  sodium chloride flush (NS) 0.9 % injection 3 mL (has no administration in time range)   stroke: mapping our early stages of recovery book (has no administration in time range)  0.9 %  sodium chloride infusion ( Intravenous Rate/Dose Verify 01/13/19 0700)  acetaminophen (TYLENOL) tablet 650 mg (has no administration in time range)    Or  acetaminophen (TYLENOL) solution 650 mg (has no administration in time range)    Or  acetaminophen (TYLENOL) suppository 650 mg (has no administration in time range)  senna-docusate (Senokot-S) tablet 1 tablet (has no administration in time range)  vancomycin (VANCOCIN) 1-5 GM/200ML-% IVPB (has no administration in time range)  iohexol (OMNIPAQUE) 350 MG/ML injection 125 mL (125 mLs Intravenous Contrast Given 01/13/19 0011)  ticagrelor (BRILINTA) 90 MG tablet (180 mg Orogastric Given 01/13/19 0245)  eptifibatide (INTEGRILIN) 20 MG/10ML injection (1 mg Intra-arterial Given 01/13/19 0404)  nitroGLYCERIN 100 mcg/mL intra-arterial injection (25 mcg Intra-arterial Given 01/13/19 0403)  aspirin 81 MG chewable tablet (81 mg Orogastric Given 01/13/19 0246)     Initial Impression / Assessment and Plan / ED Course  I have reviewed the triage vital signs and the nursing notes.  Pertinent labs & imaging results that were available during  my care of the patient were reviewed by me and considered in my medical decision making (see chart for details).     Final Clinical Impressions(s) / ED Diagnoses   Final diagnoses:  Cerebrovascular accident (CVA), unspecified mechanism (HCC)   63 y/o male presenting as code stroke. He was last known normal at 9:00pm. Around 1050pm his wife noted dysarthria, left sided facial droop and a LUE drift.   Evaluated pt in CT scanner with Dr. Otelia LimesLindzen with neurology. On exam pt has no motor deficits to the BUE and BLE. Does have left sided facial droop and dysarthria.   Reviewed CT head with which showed no evidence of bleeding. He has a mass to the right middle cranial fossa. CTA head/neck ordered.   CTA showing emergent large vessel occlusion of the right middle cerebral artery proximal M2 segment with age-indeterminate occlusion of the entire length of the cervical portion of the right internal carotid artery.  100 mL ischemic penumbra also noted.  Dr. Otelia LimesLindzen recommending endovascular therapy. Pt will require intubation in the ED. Anesthesia will be in the ED to intubate the patient.   Labs reviewed.  CBC, CMP, coags are nonacute.  CBG is normal.  COVID testing ordered.  Pt admitted to neurology service with plan to go to the IR suite.  Pt seen in conjunction with Dr. Elesa MassedWard who personally evaluated the pt and is in agreement with the plan.   ED Discharge Orders    None       Rayne DuCouture, Betsy Rosello S, PA-C 01/13/19 [id number]    Ward, Layla MawKristen N, DO 01/13/19 2301

## 2019-01-13 NOTE — ED Notes (Signed)
NIH completed by rapid response nurse.

## 2019-01-13 NOTE — Anesthesia Postprocedure Evaluation (Signed)
Anesthesia Post Note  Patient: Werner Labella  Procedure(s) Performed: RADIOLOGY WITH ANESTHESIA (N/A )     Patient location during evaluation: ICU Anesthesia Type: General Level of consciousness: awake and alert, oriented and patient cooperative Pain management: pain level controlled Vital Signs Assessment: post-procedure vital signs reviewed and stable Respiratory status: spontaneous breathing, nonlabored ventilation, respiratory function stable and patient connected to nasal cannula oxygen Cardiovascular status: blood pressure returned to baseline and stable Postop Assessment: no apparent nausea or vomiting Anesthetic complications: no Comments: Pt moving all extrem, VSS, conversant. C/o slight sore throat: reassured will resolve once taking liquid by mouth    Last Vitals:  Vitals:   01/13/19 0455 01/13/19 0500  BP: (!) 82/55 (!) 79/56  Pulse: 64 64  Resp: 17 (!) 21  Temp: 36.6 C   SpO2: 99% 99%    Last Pain:  Vitals:   01/13/19 0441  TempSrc:   PainSc: 0-No pain                 Helene Bernstein,E. Arsalan Brisbin

## 2019-01-13 NOTE — Progress Notes (Signed)
Wife Tsugio Elison [phone]

## 2019-01-13 NOTE — Progress Notes (Signed)
Discussed with Dr. Leonie Man. Will add TEE and loop for tomorrow pending TCD result to look for possible source of stroke. Will also add hypercoagulable labs.  Burnetta Sabin, MSN, APRN, ANVP-BC, AGPCNP-BC Advanced Practice Stroke Nurse [place] for Schedule & Pager information 01/13/2019 2:07 PM

## 2019-01-13 NOTE — Progress Notes (Signed)
PT Cancellation Note  Patient Details Name: Alex Heath MRN: [id number] DOB: [birth year]   Cancelled Treatment:    Reason Eval/Treat Not Completed: Patient at procedure or test/unavailable; patient down for TEE.  Will attempt to see another day.    Reginia Naas 01/13/2019, 4:22 PM  Magda Kiel, [place] [phone] 01/13/2019

## 2019-01-13 NOTE — Progress Notes (Signed)
PT Cancellation Note  Patient Details Name: Karl Erway MRN: [id number] DOB: [birth year]   Cancelled Treatment:    Reason Eval/Treat Not Completed: Patient not medically ready, sheath pulled this am, for bedrest until noon.  May attempt later if schedule allows.    Reginia Naas 01/13/2019, 9:08 AM  Magda Kiel, [place] [phone] 01/13/2019

## 2019-01-13 NOTE — Procedures (Signed)
S/P bilateral common carotid and vertebral arteriograms,followed complete revascularization of RT MCA M 1 occlusion with x1 pass with 29mm x 55mm embotrap retriever device and penumbra aspiration achieving a TICI 3 revascularization.S/P stent assisted angioplasty of symptomatic acute occlusion of RT ICA prox.

## 2019-01-13 NOTE — Code Documentation (Signed)
Responded to code stroke called at [facility] called for facial droop and weakness. Pt arrived at 2340. LSN 2100, CBG 80, CT head (-), NIH 2 for left facial droop and dysarthria. CTA/CTP- Emergent large vessel occlusion of the right middle cerebral artery proximal M2 segment with age-indeterminate occlusion of the entire length of the cervical portion of the right internal carotid artery. Plan for IR.

## 2019-01-13 NOTE — Anesthesia Preprocedure Evaluation (Addendum)
Anesthesia Evaluation  Patient identified by MRN, date of birth, ID band Patient awake    Reviewed: Allergy & Precautions, NPO status , Patient's Chart, lab work & pertinent test resultsPreop documentation limited or incomplete due to emergent nature of procedure.  History of Anesthesia Complications Negative for: history of anesthetic complications  Airway Mallampati: II  TM Distance: >3 FB Neck ROM: Full    Dental  (+) Dental Advisory Given   Pulmonary neg pulmonary ROS,    breath sounds clear to auscultation       Cardiovascular negative cardio ROS   Rhythm:Regular Rate:Normal     Neuro/Psych left-sided facial droop, difficulty speaking, abnormal gait with last known normal 9 PM CVA (acute CVA), Residual Symptoms    GI/Hepatic negative GI ROS, Neg liver ROS,   Endo/Other  negative endocrine ROS  Renal/GU negative Renal ROS     Musculoskeletal   Abdominal   Peds  Hematology negative hematology ROS (+)   Anesthesia Other Findings   Reproductive/Obstetrics                            Anesthesia Physical Anesthesia Plan  ASA: III and emergent  Anesthesia Plan: General   Post-op Pain Management:    Induction: Intravenous  PONV Risk Score and Plan: 2 and Ondansetron, Dexamethasone and Treatment may vary due to age or medical condition  Airway Management Planned: Oral ETT and Video Laryngoscope Planned  Additional Equipment: Arterial line  Intra-op Plan:   Post-operative Plan: Possible Post-op intubation/ventilation  Informed Consent: I have reviewed the patients History and Physical, chart, labs and discussed the procedure including the risks, benefits and alternatives for the proposed anesthesia with the patient or authorized representative who has indicated his/her understanding and acceptance.     Dental advisory given  Plan Discussed with: CRNA and Surgeon  Anesthesia  Plan Comments: (Discussed with patient and his wife)       Anesthesia Quick Evaluation
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
## 2019-01-13 NOTE — Progress Notes (Signed)
OT Cancellation Note  Patient Details Name: Alex Heath MRN: [id number] DOB: 1955/06/30   Cancelled Treatment:    Reason Eval/Treat Not Completed: Other (comment)(sheath)   Richelle Ito, OTR/L  Acute Rehabilitation Services Pager: [phone] Office: [phone] .  01/13/2019, 8:32 AM

## 2019-01-13 NOTE — Progress Notes (Signed)
  Echocardiogram 2D Echocardiogram has been performed.  Brannon Levene L Androw 01/13/2019, 8:45 AM

## 2019-01-13 NOTE — Progress Notes (Signed)
SLP Cancellation Note  Patient Details Name: Alex Heath MRN: [id number] DOB: 07/18/1955   Cancelled treatment:       Reason Eval/Treat Not Completed: SLP screened, no needs identified, will sign off.    Nadara Mode Tarrell 01/13/2019, 9:41 AM   Sonia Baller, MA, CCC-SLP Speech Therapy [facility] Pager: [phone]

## 2019-01-13 NOTE — Progress Notes (Signed)
Patient ID: Alex Heath, male   DOB: 07/28/1955, 63 y.o.   MRN: [id number] INR. 62 Y RT H M LSW 10 50 pm. New onset of LT facial droop,garbled speech and abnormal gait. MRSS 0. CT Brain NO ICH. ASPECTS 10 CTA occluded RT MCA M 1 seg and RT ICA prox. CTP core of 44ml with a penumbra of 131ml . CT Brain also revealed a 3cm x 2.5 cm RT middle cranial fossa mass with petrous bony erosion. OPtion of endovascular revascularization D/W parient and spouse. Reasons,procedure,alternatives reviewed.  Risks of ICH of 10 %,worsening neuro deficit,death ,inability to revascularize and vascular injury reviewed.They both expressed understanding and provided consent to proceed with the treatment. S.Anson Peddie MD.

## 2019-01-13 NOTE — ED Triage Notes (Signed)
Per [facility], pt from home w/ a c/o code stroke. Pt LNW at 2100. He went to bed at that time and then woke up to use the bathroom. At that time he had an abnormal gait, left sided facial droop, and slurred speech. No blood thinners. No arm drift or gaze. No covid s/s.  12 lead NSR w/ PACs 158/80 HR 84 CBG 80  Bilateral 18 ga IVs

## 2019-01-13 NOTE — Anesthesia Procedure Notes (Signed)
Procedure Name: Intubation Date/Time: 01/13/2019 1:45 AM Performed by: Clovis Cao, CRNA Pre-anesthesia Checklist: Patient identified, Emergency Drugs available, Suction available, Patient being monitored and Timeout performed Patient Re-evaluated:Patient Re-evaluated prior to induction Oxygen Delivery Method: Ambu bag Preoxygenation: Pre-oxygenation with 100% oxygen Induction Type: IV induction, Rapid sequence and Cricoid Pressure applied Laryngoscope Size: Glidescope Grade View: Grade I Tube type: Oral Tube size: 8.0 mm Number of attempts: 1 Airway Equipment and Method: Stylet and Video-laryngoscopy Placement Confirmation: ETT inserted through vocal cords under direct vision,  CO2 detector and breath sounds checked- equal and bilateral Secured at: 22 cm Tube secured with: Tape Dental Injury: Teeth and Oropharynx as per pre-operative assessment

## 2019-01-13 NOTE — Progress Notes (Signed)
    [facility] has been requested to perform a transesophageal echocardiogram on Alex Heath for stroke.  After careful review of history and examination, the risks and benefits of transesophageal echocardiogram have been explained including risks of esophageal damage, perforation (1:10,000 risk), bleeding, pharyngeal hematoma as well as other potential complications associated with conscious sedation including aspiration, arrhythmia, respiratory failure and death. Alternatives to treatment were discussed, questions were answered. Patient is willing to proceed.   Cecilie Kicks, NP  01/13/2019 2:41 PM

## 2019-01-13 NOTE — Progress Notes (Signed)
Left 4 french sheath removal.  No hematoma present before sheath pull.  Pulled at 1020 and held manual pressure for 10 minutes with a V pad.  No complications with groin level 0 and pulses present after pressure was held.  Verified site with Lurline Hare.  France Ravens RT R, VI Cory Sandling RT R
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
## 2019-01-13 NOTE — Progress Notes (Signed)
Bilateral lower extremity venous duplex has been completed. Preliminary results can be found in CV Proc through chart review.   01/13/19 11:26 AM Carlos Levering RVT

## 2019-01-14 ENCOUNTER — Encounter (HOSPITAL_COMMUNITY): Payer: Self-pay | Admitting: Interventional Radiology

## 2019-01-14 ENCOUNTER — Inpatient Hospital Stay (HOSPITAL_COMMUNITY): Payer: Commercial Managed Care - PPO

## 2019-01-14 ENCOUNTER — Encounter (HOSPITAL_COMMUNITY)
Admission: EM | Disposition: A | Discharge status: Home / Self Care | Payer: Commercial Managed Care - PPO | Source: Home / Self Care | Attending: Neurology

## 2019-01-14 ENCOUNTER — Encounter (HOSPITAL_COMMUNITY)
Admission: EM | Disposition: A | Discharge status: Home / Self Care | Payer: Self-pay | Source: Home / Self Care | Attending: Neurology

## 2019-01-14 DIAGNOSIS — I639 Cerebral infarction, unspecified: Secondary | ICD-10-CM

## 2019-01-14 DIAGNOSIS — E785 Hyperlipidemia, unspecified: Secondary | ICD-10-CM | POA: Diagnosis present

## 2019-01-14 DIAGNOSIS — I6389 Other cerebral infarction: Secondary | ICD-10-CM

## 2019-01-14 DIAGNOSIS — D62 Acute posthemorrhagic anemia: Secondary | ICD-10-CM | POA: Diagnosis present

## 2019-01-14 HISTORY — PX: LOOP RECORDER INSERTION: EP1214

## 2019-01-14 LAB — RPR: RPR Ser Ql: NONREACTIVE

## 2019-01-14 LAB — BASIC METABOLIC PANEL
Anion gap: 7 (ref 5–15)
BUN: 9 mg/dL (ref 8–23)
CO2: 21 mmol/L — ABNORMAL LOW (ref 22–32)
Calcium: 8.3 mg/dL — ABNORMAL LOW (ref 8.9–10.3)
Chloride: 112 mmol/L — ABNORMAL HIGH (ref 98–111)
Creatinine, Ser: 1.11 mg/dL (ref 0.61–1.24)
GFR calc Af Amer: >60 mL/min (ref >60)
GFR calc non Af Amer: >60 mL/min (ref >60)
Glucose, Bld: 108 mg/dL — ABNORMAL HIGH (ref 70–99)
Potassium: 3.6 mmol/L (ref 3.5–5.1)
Sodium: 140 mmol/L (ref 135–145)

## 2019-01-14 LAB — CBC
HCT: 33.3 % — ABNORMAL LOW (ref 39.0–52.0)
Hemoglobin: 10.5 g/dL — ABNORMAL LOW (ref 13.0–17.0)
MCH: 29.9 pg (ref 26.0–34.0)
MCHC: 31.5 g/dL (ref 30.0–36.0)
MCV: 94.9 fL (ref 80.0–100.0)
Platelets: 175 10*3/uL (ref 150–400)
RBC: 3.51 MIL/uL — ABNORMAL LOW (ref 4.22–5.81)
RDW: 13.5 % (ref 11.5–15.5)
WBC: 8.6 10*3/uL (ref 4.0–10.5)
nRBC: 0 % (ref 0.0–0.2)

## 2019-01-14 LAB — ANTINUCLEAR ANTIBODIES, IFA: ANA Ab, IFA: NEGATIVE

## 2019-01-14 SURGERY — LOOP RECORDER INSERTION

## 2019-01-14 SURGERY — INVASIVE LAB ABORTED CASE

## 2019-01-14 MED ORDER — LIDOCAINE-EPINEPHRINE 1 %-1:100000 IJ SOLN
INTRAMUSCULAR | Status: AC
  Filled 2019-01-14: qty 1

## 2019-01-14 MED ORDER — MIDAZOLAM HCL (PF) 10 MG/2ML IJ SOLN
INTRAMUSCULAR | Status: DC | PRN
  Administered 2019-01-14 (×2): 2 mg via INTRAVENOUS
  Administered 2019-01-14: 1 mg via INTRAVENOUS
  Administered 2019-01-14: 2 mg via INTRAVENOUS

## 2019-01-14 MED ORDER — MIDAZOLAM HCL (PF) 5 MG/ML IJ SOLN
INTRAMUSCULAR | Status: AC
  Filled 2019-01-14: qty 2

## 2019-01-14 MED ORDER — ASPIRIN 81 MG PO CHEW: 81.0000 mg | CHEWABLE_TABLET | Freq: Every day | ORAL | Status: AC

## 2019-01-14 MED ORDER — TICAGRELOR 90 MG PO TABS: 90.0000 mg | ORAL_TABLET | Freq: Two times a day (BID) | ORAL | 2 refills | Status: DC

## 2019-01-14 MED ORDER — BUTAMBEN-TETRACAINE-BENZOCAINE 2-2-14 % EX AERO
INHALATION_SPRAY | CUTANEOUS | Status: DC | PRN
  Administered 2019-01-14: 2 via TOPICAL

## 2019-01-14 MED ORDER — LACTATED RINGERS IV SOLN
INTRAVENOUS | Status: DC
  Administered 2019-01-14: 10:00:00 via INTRAVENOUS

## 2019-01-14 MED ORDER — LIDOCAINE-EPINEPHRINE 1 %-1:100000 IJ SOLN
INTRAMUSCULAR | Status: DC | PRN
  Administered 2019-01-14: 30 mL

## 2019-01-14 MED ORDER — FENTANYL CITRATE (PF) 100 MCG/2ML IJ SOLN
INTRAMUSCULAR | Status: DC | PRN
  Administered 2019-01-14 (×4): 25 ug via INTRAVENOUS

## 2019-01-14 MED ORDER — ONDANSETRON HCL 4 MG/2ML IJ SOLN: 4.0000 mg | Freq: Four times a day (QID) | INTRAMUSCULAR | Status: DC | PRN

## 2019-01-14 MED ORDER — ATORVASTATIN CALCIUM 40 MG PO TABS: 40.0000 mg | ORAL_TABLET | Freq: Every day | ORAL | 2 refills | Status: DC

## 2019-01-14 MED ORDER — FENTANYL CITRATE (PF) 100 MCG/2ML IJ SOLN
INTRAMUSCULAR | Status: AC
  Filled 2019-01-14: qty 2

## 2019-01-14 MED ORDER — ACETAMINOPHEN 325 MG PO TABS: 325.0000 mg | ORAL_TABLET | ORAL | Status: DC | PRN

## 2019-01-14 SURGICAL SUPPLY — 2 items
LOOP REVEAL LINQSYS (Prosthesis & Implant Heart) ×1 IMPLANT
PACK LOOP INSERTION (CUSTOM PROCEDURE TRAY) ×2 IMPLANT

## 2019-01-14 NOTE — Progress Notes (Signed)
STROKE TEAM PROGRESS NOTE   INTERVAL HISTORY  Patient is neurologically stable.  Is scheduled for TEE and loop recorder today and likely discharge after that He has been seen by physical and Occupational Therapy and has no therapy needs.  His hematocrit dropped after the procedure 10 points but it is stable today compared to yesterday.  Is slightly hypotensive with blood pressure 90/61-this is a.m. but has since increased to 009 systolic. Vitals:   01/14/19 0500 01/14/19 0600 01/14/19 0700 01/14/19 0800  BP: 90/61 (!) 81/69 106/67 103/78  Pulse: (!) 55 (!) 51 66 62  Resp: 16 15 18  (!) 25  Temp:    98.6 F (37 C)  TempSrc:    Oral  SpO2: 97% 97% 98% 98%  Weight:      Height:        CBC:  Recent Labs  Lab 01/12/19 2340  01/13/19 1018 01/14/19 0115  WBC 9.3  --  12.7* 8.6  NEUTROABS 4.5  --  10.5*  --   HGB 13.9   < > 11.3* 10.5*  HCT 43.1   < > 34.5* 33.3*  MCV 94.7  --  92.7 94.9  PLT 196  --  181 175   < > = values in this interval not displayed.    Basic Metabolic Panel:  Recent Labs  Lab 01/13/19 1018 01/14/19 0115  NA 138 140  K 3.7 3.6  CL 111 112*  CO2 19* 21*  GLUCOSE 110* 108*  BUN 11 9  CREATININE 1.10 1.11  CALCIUM 8.0* 8.3*    PHYSICAL EXAM Pleasant middle-aged Caucasian male currently not in distress. . Afebrile. Head is nontraumatic. Neck is supple without bruit.    Cardiac exam no murmur or gallop. Lungs are clear to auscultation. Distal pulses are well felt. Neurological Exam ;  Awake  Alert oriented x 3. Normal speech and language.eye movements full without nystagmus.fundi were not visualized. Vision acuity and fields appear normal. Hearing is normal. Palatal movements are normal. Face symmetric. Tongue midline. Normal strength, tone, reflexes and coordination. Normal sensation. Gait deferred.    ASSESSMENT/PLAN [name] is a 63 y.o. male with no significant past medical history presenting with left facial droop, garbled speech  and abnormal gait. Found to have a R MCA/ICA occlusion and sent to IR.  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  R middle cranial fossa mass with extension through the skull base.  ASPECTS 10.     CTA head & neck R MCA M2 ELVO.  R ICA cervical ELVO.  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 Ring enhancing lesion R middle cranial fossa  MRI  Acute/subacute cortical anterior R insular and lateral putamen infarct. 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   2D Echo EF 60-65%. No source of embolus   Lower extremity Dopplers no DVT  TCD with bubble no HITS at rest or during valsalva  TEE attempted unable to pass probe  Loop recorder pending  LDL 83  HgbA1c 6.0  HIV negative  SCDs for VTE  prophylaxis  No antithrombotic prior to admission, now on aspirin 81 mg daily and Brilinta (ticagrelor) 90 mg bid.  Continue at discharge  Therapy recommendations: no therapy needs  Disposition: d/c home  Hyperlipidemia  Home meds:  No statin listed on med rec, but wife reports he takes cholesterol medicine  LDL 83, goal < 70  Added high intensity statin -adjust meds/dose based on home meds once med rec completed  Continue statin at discharge  Recheck lipid panel in 1 month and adjust as to meet goal < 70  Other Stroke Risk Factors  ETOH use, advised to drink no more than 2 drink(s) a day  Other Active Problems  Leukocytosis, resolved. WBC 12.7->8.6  Acute blood loss anemia post IR, Hb 13.9-> 11.3->10.5  Hospital day # 1 Patient  neurologically is doing well.  Plan to discharge him home today after loop recorder insertion.  Continue aspirin and Brilinta for carotid stent and increase to home dose of Lipitor from 20 to 40 mg daily.  Follow-up as an outpatient in the stroke clinic in 6 weeks.  Delia HeadyPramod Sethi, MD Medical Director [facility] Pager: [phone] 01/14/2019 9:31 AM   To contact Stroke Continuity provider, please refer to [email or web address]. After hours, contact General Neurology

## 2019-01-14 NOTE — Evaluation (Signed)
Occupational Therapy Evaluation Patient Details Name: Alex Heath MRN: [id number] DOB: 1956/05/02 Today's Date: 01/14/2019    History of Present Illness Patient is a 63 y/o male who presents with garbled speech and facial droop. Found to have right MCA infarct s/p IR with revascularization Rt M1 and Rt ICA with stent. Brain MRI-acute/subacute cortical infarcts right insular cortex and right putamen. PMH includes HLD.   Clinical Impression   Patient evaluated by Occupational Therapy with no further acute OT needs identified. All education has been completed and the patient has no further questions. See below for any follow-up Occupational Therapy or equipment needs. OT to sign off. Thank you for referral.     Follow Up Recommendations  No OT follow up    Equipment Recommendations  None recommended by OT    Recommendations for Other Services       Precautions / Restrictions Precautions Precautions: None Restrictions Weight Bearing Restrictions: No      Mobility Bed Mobility Overal bed mobility: Modified Independent                Transfers Overall transfer level: Modified independent Equipment used: None             General transfer comment: No assist needed. Stood without difficulties.    Balance Overall balance assessment: No apparent balance deficits (not formally assessed)                                         ADL either performed or assessed with clinical judgement   ADL Overall ADL's : Modified independent                                             Vision Baseline Vision/History: Wears glasses Wears Glasses: Reading only       Perception     Praxis      Pertinent Vitals/Pain Pain Assessment: No/denies pain     Hand Dominance Right   Extremity/Trunk Assessment Upper Extremity Assessment Upper Extremity Assessment: Overall WFL for tasks assessed   Lower Extremity Assessment Lower  Extremity Assessment: Overall WFL for tasks assessed   Cervical / Trunk Assessment Cervical / Trunk Assessment: Normal   Communication Communication Communication: No difficulties   Cognition Arousal/Alertness: Awake/alert Behavior During Therapy: WFL for tasks assessed/performed Overall Cognitive Status: Within Functional Limits for tasks assessed                                     General Comments  able to occlude vision without lob to simulate shower     Exercises     Shoulder Instructions      Home Living Family/patient expects to be discharged to:: Private residence Living Arrangements: Spouse/significant other Available Help at Discharge: Family;Available 24 hours/day Type of Home: House Home Access: Level entry     Home Layout: Two level;Able to live on main level with bedroom/bathroom Alternate Level Stairs-Number of Steps: 1 flight   Bathroom Shower/Tub: Teacher, early years/pre: Standard     Home Equipment: Environmental consultant - 2 wheels;Bedside commode   Additional Comments: DME from wife who had THA.      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        OT Problem List:        OT Treatment/Interventions:      OT Goals(Current goals can be found in the care plan section) Acute Rehab OT Goals Patient Stated Goal: to get up and brush my teeth  OT Frequency:     Barriers to D/C:            Co-evaluation              AM-PAC OT "6 Clicks" Daily Activity     Outcome Measure Help from another person eating meals?: None Help from another person taking care of personal grooming?: None Help from another person toileting, which includes using toliet, bedpan, or urinal?: None Help from another person bathing (including washing, rinsing, drying)?: None Help from another person to put on and taking off regular upper body clothing?: None Help from another  person to put on and taking off regular lower body clothing?: None 6 Click Score: 24   End of Session    Activity Tolerance: Patient tolerated treatment well Patient left: Other (comment)(transfer to TEE with transporter)                   Time: [id number] OT Time Calculation (min): 8 min Charges:  OT General Charges $OT Visit: 1 Visit OT Evaluation $OT Eval Low Complexity: 1 Low   Mateo FlowBrynn Tangia Pinard, OTR/L  Acute Rehabilitation Services Pager: [phone] Office: [phone] .   Mateo FlowBrynn Jaaziel Peatross 01/14/2019, 10:53 AM

## 2019-01-14 NOTE — CV Procedure (Signed)
INDICATIONS: stroke  PROCEDURE:   Informed consent was obtained prior to the procedure. The risks, benefits and alternatives for the procedure were discussed and the patient comprehended these risks.  Risks include, but are not limited to, cough, sore throat, vomiting, nausea, somnolence, esophageal and stomach trauma or perforation, bleeding, low blood pressure, aspiration, pneumonia, infection, trauma to the teeth and death.    After a procedural time-out, the oropharynx was anesthetized with 20% benzocaine spray.   During this procedure the patient was administered a total of Versed 7 mg and Fentanyl 100  mg to achieve and maintain moderate conscious sedation.  The patient's heart rate, blood pressure, and oxygen saturationweare monitored continuously during the procedure. The period of conscious sedation was 15 minutes, of which I was present face-to-face 100% of this time.  The transesophageal probe was unable to be safely inserted into the patient's esophagus due to copious secretions and inability to safely sedate further. The patient was kept under observation until the patient left the procedure room.  The patient left the procedure room in stable condition.   Agitated microbubble saline contrast was not administered.  COMPLICATIONS:    There were no immediate complications.  FINDINGS:  Unable to perform TEE due to inability to safely sedate adequately for probe passage.   RECOMMENDATIONS:    Can return to hospital room when alert.   Time Spent Directly with the Patient:  30 minutes   Alex Heath 01/14/2019, 11:28 AM

## 2019-01-14 NOTE — Interval H&P Note (Signed)
History and Physical Interval Note:  01/14/2019 11:00 AM  Alex Heath  has presented today for surgery, with the diagnosis of Stroke.  The various methods of treatment have been discussed with the patient and family. After consideration of risks, benefits and other options for treatment, the patient has consented to  Procedure(s): TRANSESOPHAGEAL ECHOCARDIOGRAM (TEE) (N/A) as a surgical intervention.  The patient's history has been reviewed, patient examined, no change in status, stable for surgery.  I have reviewed the patient's chart and labs.  Questions were answered to the patient's satisfaction.     Elouise Munroe

## 2019-01-14 NOTE — Plan of Care (Signed)
Patient educated and ready for discharge.  

## 2019-01-14 NOTE — Evaluation (Signed)
Physical Therapy Evaluation Patient Details Name: Alex Heath MRN: [id number] DOB: Nov 03, 1955 Today's Date: 01/14/2019   History of Present Illness  Patient is a 63 y/o male who presents with garbled speech and facial droop. Found to have right MCA infarct s/p IR with revascularization Rt M1 and Rt ICA with stent. Brain MRI-acute/subacute cortical infarcts right insular cortex and right putamen. PMH includes HLD.  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 Tolerated transfers and gait Mod I and able to perform ADL tasks in bathroom reaching outside BoS without difficulty or LOB. Ambulation limited as pt going down for test. Reports all symptoms have resolved. Pt with good awareness of needing to wear glasses to sign consent form and avoiding getting tangled in lines in the bathroom. Pt does not require further skilled therapy services as pt functioning close to baseline and reports no other concerns. Will have support of spouse at home. All education completed. Discharge from therapy.    Follow Up Recommendations No PT follow up    Equipment Recommendations  None recommended by PT    Recommendations for Other Services       Precautions / Restrictions Precautions Precautions: None Restrictions Weight Bearing Restrictions: No      Mobility  Bed Mobility Overal bed mobility: Modified Independent                Transfers Overall transfer level: Modified independent Equipment used: None             General transfer comment: No assist needed. Stood without difficulties.  Ambulation/Gait Ambulation/Gait assistance: Modified independent (Device/Increase time) Gait Distance (Feet): 25 Feet Assistive device: None   Gait velocity: good speed   General Gait Details: Walked within room and to/from bathroom without evidence of imbalance or difficulty, avoiding lines etc.  Stairs            Wheelchair  Mobility    Modified Rankin (Stroke Patients Only) Modified Rankin (Stroke Patients Only) Pre-Morbid Rankin Score: No symptoms Modified Rankin: No significant disability     Balance Overall balance assessment: No apparent balance deficits (not formally assessed)                                           Pertinent Vitals/Pain Pain Assessment: No/denies pain    Home Living Family/patient expects to be discharged to:: Private residence Living Arrangements: Spouse/significant other Available Help at Discharge: Family;Available 24 hours/day Type of Home: House Home Access: Level entry     Home Layout: Two level;Able to live on main level with bedroom/bathroom Home Equipment: Gilford Rile - 2 wheels;Bedside commode Additional Comments: DME from wife who had THA.    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     Hand Dominance        Extremity/Trunk Assessment   Upper Extremity Assessment Upper Extremity Assessment: Defer to OT evaluation    Lower Extremity Assessment Lower Extremity Assessment: Overall WFL for tasks assessed    Cervical / Trunk Assessment Cervical / Trunk Assessment: Normal  Communication   Communication: No difficulties  Cognition Arousal/Alertness: Awake/alert Behavior During Therapy: WFL for tasks assessed/performed Overall Cognitive Status: Within Functional Limits for tasks assessed  General Comments General comments (skin integrity, edema, etc.): Able to stand at sink and brush teeth reaching outside BoS without difficulties.    Exercises     Assessment/Plan    PT Assessment Patent does not need any further PT services  PT Problem List         PT Treatment Interventions      PT Goals (Current goals can be found in the Care Plan section)  Acute Rehab PT Goals Patient Stated Goal: to get up and brush my  teeth PT Goal Formulation: All assessment and education complete, DC therapy    Frequency     Barriers to discharge        Co-evaluation               AM-PAC PT "6 Clicks" Mobility  Outcome Measure Help needed turning from your back to your side while in a flat bed without using bedrails?: None Help needed moving from lying on your back to sitting on the side of a flat bed without using bedrails?: None Help needed moving to and from a bed to a chair (including a wheelchair)?: None Help needed standing up from a chair using your arms (e.g., wheelchair or bedside chair)?: None Help needed to walk in hospital room?: None Help needed climbing 3-5 steps with a railing? : A Little 6 Click Score: 23    End of Session   Activity Tolerance: Patient tolerated treatment well Patient left: in chair;with nursing/sitter in room(put in w/c to go down for TEE) Nurse Communication: Mobility status PT Visit Diagnosis: Difficulty in walking, not elsewhere classified (R26.2)    Time: [id number] PT Time Calculation (min) (ACUTE ONLY): 15 min   Charges:   PT Evaluation $PT Eval Moderate Complexity: 1 Mod          Mylo RedShauna Elisheva Fallas, PT, DPT Acute Rehabilitation Services Pager [phone] Office [phone]      Blake DivineShauna A Lanier EnsignHartshorne 01/14/2019, 10:38 AM

## 2019-01-14 NOTE — Consult Note (Addendum)
ELECTROPHYSIOLOGY CONSULT NOTE  Patient ID: Alex Heath Milhouse MRN: [id number], DOB/AGE: [birth year]   Admit date: 01/12/2019 Date of Consult: 01/14/2019  Primary Physician: Darrow BussingKoirala, Dibas, MD Primary Cardiologist: none Reason for Consultation: Cryptogenic stroke ; recommendations regarding Implantable Loop Recorder, requested by Dr. Pearlean BrownieSethi  History of Present Illness Alex Heath Ahuja was admitted on 01/12/2019 with speech difficulties, left facial droop, stroke.  Underwent tPA and emergent IR procedure 2/2 to a R MCA infarct with revascularization R M1 and R ICA w/ stent - embolic vs chiropractic trauma   he has undergone workup for stroke including echocardiogram and carotid angio  The patient has been monitored on telemetry which has demonstrated sinus rhythm with no arrhythmias.  Inpatient stroke work-up is to be completed with a TEE.  LE venous dopplers and TCD have been negative  Echocardiogram this admission demonstrated    IMPRESSIONS  1. The left ventricle has normal systolic function with an ejection fraction of 60-65%. The cavity size was normal. Left ventricular diastolic parameters were normal.  2. The right ventricle has normal systolic function. The cavity was normal.  3. The aortic root is normal in size and structure.  4. The mitral valve is grossly normal.  5. The tricuspid valve is grossly normal.  6. Normal LV function; no significant valvular heart disease.  FINDINGS  Left Ventricle: The left ventricle has normal systolic function, with an ejection fraction of 60-65%. The cavity size was normal. There is no increase in left ventricular wall thickness. Left ventricular diastolic parameters were normal.  Right Ventricle: The right ventricle has normal systolic function. The cavity was normal.  Left Atrium: Left atrial size was normal in size.  Right Atrium: Right atrial size was normal in size. Right atrial pressure is estimated at 3 mmHg.  Interatrial  Septum: No atrial level shunt detected by color flow Doppler.  Pericardium: There is no evidence of pericardial effusion.  Mitral Valve: The mitral valve is grossly normal. Mitral valve regurgitation is trivial by color flow Doppler.  Tricuspid Valve: The tricuspid valve is grossly normal. Tricuspid valve regurgitation is trivial by color flow Doppler.  Aortic Valve: The aortic valve is tricuspid Mild thickening of the aortic valve. Aortic valve regurgitation was not visualized by color flow Doppler. There is No stenosis of the aortic valve  Pulmonic Valve: The pulmonic valve was grossly normal. Pulmonic valve regurgitation is not visualized by color flow Doppler.  Aorta: The aortic root is normal in size and structure.  Venous: The inferior vena cava is normal in size with greater than 50% respiratory variability.  Additional Comments: Normal LV function; no significant valvular heart disease.     Lab work is reviewed.  Prior to admission, the patient denies chest pain, shortness of breath, dizziness, palpitations, or syncope.  They are recovering from their stroke with plans to home at discharge.     Past Medical History:  Diagnosis Date   Hyperlipemia      Surgical History: No past surgical history on file.   Medications Prior to Admission  Medication Sig Dispense Refill Last Dose   ondansetron (ZOFRAN ODT) 8 MG disintegrating tablet Take 1 tablet (8 mg total) by mouth every 8 (eight) hours as needed for nausea or vomiting. 20 tablet 0    TURMERIC PO Take 3 tablets by mouth daily.      Zinc 100 MG TABS Take 100 mg by mouth daily.       Inpatient Medications:    stroke: mapping our early  stages of recovery book   Does not apply Once   aspirin  81 mg Oral Daily   Or   aspirin  81 mg Per Tube Daily   atorvastatin  40 mg Oral q1800   Chlorhexidine Gluconate Cloth  6 each Topical Daily   ticagrelor  90 mg Oral BID   Or   ticagrelor  90 mg Per Tube  BID    Allergies:  Allergies  Allergen Reactions   Penicillins Other (See Comments)    childhood    Social History   Socioeconomic History   Marital status: Married    Spouse name: Not on file   Number of children: Not on file   Years of education: Not on file   Highest education level: Not on file  Occupational History   Not on file  Social Needs   Financial resource strain: Not on file   Food insecurity    Worry: Not on file    Inability: Not on file   Transportation needs    Medical: Not on file    Non-medical: Not on file  Tobacco Use   Smoking status: Never Smoker   Smokeless tobacco: Never Used  Substance and Sexual Activity   Alcohol use: Yes    Comment: occassionally   Drug use: Never   Sexual activity: Not on file  Lifestyle   Physical activity    Days per week: Not on file    Minutes per session: Not on file   Stress: Not on file  Relationships   Social connections    Talks on phone: Not on file    Gets together: Not on file    Attends religious service: Not on file    Active member of club or organization: Not on file    Attends meetings of clubs or organizations: Not on file    Relationship status: Not on file   Intimate partner violence    Fear of current or ex partner: Not on file    Emotionally abused: Not on file    Physically abused: Not on file    Forced sexual activity: Not on file  Other Topics Concern   Not on file  Social History Narrative   Not on file     Family history: father had valvular heart disease, lived to his 4's   Review of Systems: All other systems reviewed and are otherwise negative except as noted above.  Physical Exam: Vitals:   01/14/19 0500 01/14/19 0600 01/14/19 0700 01/14/19 0800  BP: 90/61 (!) 81/69 106/67 103/78  Pulse: (!) 55 (!) 51 66 62  Resp: 16 15 18  (!) 25  Temp:      TempSrc:      SpO2: 97% 97% 98% 98%  Weight:      Height:        GEN- The patient is well appearing,  alert and oriented x 3 today.   Head- normocephalic, atraumatic Eyes-  Sclera clear, conjunctiva pink Ears- hearing intact Oropharynx- clear Neck- supple Lungs- CTA b/l, normal work of breathing Heart- RRR, no murmurs, rubs or gallops  GI- soft, NT, ND Extremities- no clubbing, cyanosis, or edema MS- no significant deformity or atrophy Skin- no rash or lesion Psych- euthymic mood, full affect   Labs:   Lab Results  Component Value Date   WBC 8.6 01/14/2019   HGB 10.5 (L) 01/14/2019   HCT 33.3 (L) 01/14/2019   MCV 94.9 01/14/2019   PLT 175 01/14/2019    Recent  Labs  Lab 01/12/19 2340  01/14/19 0115  NA 140   < > 140  K 4.1   < > 3.6  CL 109   < > 112*  CO2 22   < > 21*  BUN 16   < > 9  CREATININE 1.21   < > 1.11  CALCIUM 9.4   < > 8.3*  PROT 6.9  --   --   BILITOT 0.7  --   --   ALKPHOS 43  --   --   ALT 27  --   --   AST 26  --   --   GLUCOSE 111*   < > 108*   < > = values in this interval not displayed.   Lab Results  Component Value Date   TROPONINI <0.30 10/25/2013   Lab Results  Component Value Date   CHOL 153 01/13/2019   Lab Results  Component Value Date   HDL 56 01/13/2019   Lab Results  Component Value Date   LDLCALC 83 01/13/2019   Lab Results  Component Value Date   TRIG 72 01/13/2019   Lab Results  Component Value Date   CHOLHDL 2.7 01/13/2019   No results found for: LDLDIRECT  No results found for: DDIMER   Radiology/Studies:   Ct Code Stroke Cta Head W/wo Contrast Result Date: 01/13/2019 CLINICAL DATA:  Left-sided weakness and facial droop EXAM: CT ANGIOGRAPHY HEAD AND NECK CT PERFUSION BRAIN TECHNIQUE: Multidetector CT imaging of the head and neck was performed using the standard protocol during bolus administration of intravenous contrast. Multiplanar CT image reconstructions and MIPs were obtained to evaluate the vascular anatomy. Carotid stenosis measurements (when applicable) are obtained utilizing NASCET criteria, using the  distal internal carotid diameter as the denominator. Multiphase CT imaging of the brain was performed following IV bolus contrast injection. Subsequent parametric perfusion maps were calculated using RAPID software. CONTRAST:  125mL OMNIPAQUE IOHEXOL 350 MG/ML SOLN COMPARISON:  Head CT same day FINDINGS: CTA NECK FINDINGS SKELETON: There is no bony spinal canal stenosis. No lytic or blastic lesion. OTHER NECK: Normal pharynx, larynx and major salivary glands. No cervical lymphadenopathy. Unremarkable thyroid gland. UPPER CHEST: No pneumothorax or pleural effusion. No nodules or masses. AORTIC ARCH: There is no calcific atherosclerosis of the aortic arch. There is no aneurysm, dissection or hemodynamically significant stenosis of the visualized ascending aorta and aortic arch. Conventional 3 vessel aortic branching pattern. The visualized proximal subclavian arteries are widely patent. RIGHT CAROTID SYSTEM: --Common carotid artery: Widely patent origin without common carotid artery dissection or aneurysm. --Internal carotid artery: The right ICA is occluded at its origin. --External carotid artery: No acute abnormality. LEFT CAROTID SYSTEM: --Common carotid artery: Widely patent origin without common carotid artery dissection or aneurysm. --Internal carotid artery: No dissection, occlusion or aneurysm. Mild atherosclerotic calcification at the carotid bifurcation without hemodynamically significant stenosis. --External carotid artery: No acute abnormality. VERTEBRAL ARTERIES: Codominant configuration. Both origins are clearly patent. No dissection, occlusion or flow-limiting stenosis to the skull base (V1-V3 segments). CTA HEAD FINDINGS POSTERIOR CIRCULATION: --Vertebral arteries: Normal V4 segments. --Posterior inferior cerebellar arteries (PICA): Patent origins from the vertebral arteries. --Anterior inferior cerebellar arteries (AICA): Patent origins from the basilar artery. --Basilar artery: Normal. --Superior  cerebellar arteries: Normal. --Posterior cerebral arteries (PCA): Normal. The left PCA is partially supplied by a posterior communicating artery (p-comm). ANTERIOR CIRCULATION: --Intracranial internal carotid arteries: There is reconstitution of the right internal carotid artery at the proximal cavernous segment due to  collateral flow. The left ICA is normal. --Anterior cerebral arteries (ACA): Normal. Both A1 segments are present. Patent anterior communicating artery (a-comm). --Middle cerebral arteries (MCA): There are early bilateral MCA bifurcations. The right MCA is occluded at the proximal M2 segment. There is intermediate collateralization. The left MCA is normal. VENOUS SINUSES: As permitted by contrast timing, patent. ANATOMIC VARIANTS: None Review of the MIP images confirms the above findings. CT Brain Perfusion Findings: ASPECTS: 10 CBF (<30%) Volume: 5mL Perfusion (Tmax>6.0s) volume: 105mL Mismatch Volume: 100mL Infarction Location:Right MCA territory IMPRESSION: 1. 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 2. 100 mL ischemic penumbra within the right MCA territory. ASPECTS 10. 3. 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 Critical Value/emergent results were called by telephone at the time of interpretation on 01/13/2019 at 12:22 am to Dr. Caryl PinaERIC LINDZEN , who verbally acknowledged these results. Electronically Signed   By: Deatra RobinsonKevin  Herman M.D.   On: 01/13/2019 00:34     Mr Laqueta JeanBrain W WJWo Contrast Result Date: 01/13/2019 CLINICAL DATA:  Status post revascularization of right M1 occlusion. Right petrous apex mass. EXAM: MRI HEAD WITHOUT AND WITH CONTRAST TECHNIQUE: Multiplanar, multiecho pulse sequences of the brain and surrounding structures were obtained without and with intravenous contrast. CONTRAST:   7 mL Gadavist COMPARISON:  CT head without contrast 01/12/2019 and CTA and CT perfusion 01/13/2019 FINDINGS: Brain: Lumbar fusion weighted images demonstrate acute cortical infarct along the anterior right insular cortex. There is minimal restricted diffusion involving the lateral aspect of the right putamen. No other significant infarct is present. A mass lesion centered at the right petrous apex demonstrates significant pre contrast T1 shortening. The measures 3.1 x 3.0 x 2.6 cm. This is separate from the internal auditory canal but may impact Dorello's canal. There is no contralateral lesion. Remote white matter infarct or more likely congenital migrational anomaly in the anterior right frontal lobe appears benign. Vascular: Flow is present in the major intracranial arteries. Skull and upper cervical spine: Sellar and suprasellar lesions are normal. Cavernous sinus is normal. Skull base is otherwise unremarkable. Upper cervical spine is within normal limits. Sinuses/Orbits: Mild diffuse mucosal thickening is present throughout the ethmoid air cells. There is a fluid level in the right maxillary sinus. Mild mucosal thickening is present in the left maxillary sinus. The paranasal sinuses and mastoid air cells are otherwise clear. The globes and orbits are within normal limits. IMPRESSION: 1. Acute/subacute nonhemorrhagic cortical infarcts involving the anterior right insular cortex and lateral aspect of the right putamen. 2. No other significant right MCA territory infarct. 3. 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 There is no definite enhancement. There is some scalloping of the bone on the CT scan. There is no evidence for vascular lesion on the angiogram. Electronically Signed   By: Marin Robertshristopher  Mattern M.D.   On: 01/13/2019 14:26     Vas [place] Transcranial Doppler W Bubbles Result Date: 01/13/2019  Transcranial Doppler with  Bubble Indications: Stroke. Performing Technologist: Jeb LeveringJill Parker RDMS, RVT  Examination Guidelines: A complete evaluation includes B-mode imaging, spectral Doppler, color Doppler, and power Doppler as needed of all accessible portions of each vessel. Bilateral testing is considered an integral part of a complete examination. Limited examinations for reoccurring indications may be performed  as noted.  Summary:  A vascular evaluation was performed. The right middle cerebral artery was studied. An IV was inserted into the patient's left forearm. Verbal informed consent was obtained.  No HITS at rest or during Valsalva. No apparent PFO. *See table(s) above for measurements and observations.    Preliminary      Ct Head Code Stroke Wo Contrast Result Date: 01/13/2019 CLINICAL DATA:  Code stroke.  Left-sided weakness and facial droop EXAM: CT HEAD WITHOUT CONTRAST TECHNIQUE: Contiguous axial images were obtained from the base of the skull through the vertex without intravenous contrast. COMPARISON:  None. FINDINGS: Brain: There is a mass of the right middle cranial fossa that measures approximately 3.0 x 2.5 cm. There is widening of the right carotid canal and jugular foramen. Small region of hypoattenuation in the right frontal lobe, within the ACA territory, likely chronic. No acute hemorrhage or other extra-axial collection. Vascular: No abnormal hyperdensity of the major intracranial arteries or dural venous sinuses. No intracranial atherosclerosis. Skull: No skull fracture.  Skull base changes as above. Sinuses/Orbits: No fluid levels or advanced mucosal thickening of the visualized paranasal sinuses. No mastoid or middle ear effusion. The orbits are normal. ASPECTS [facility] Stroke Program Early CT Score) - Ganglionic level infarction (caudate, lentiform nuclei, internal capsule, insula, M1-M3 cortex): 7 - Supraganglionic infarction (M4-M6 cortex): 3 Total score (0-10 with 10 being normal): 10 IMPRESSION: 1. No  intracranial hemorrhage. 2. ASPECTS is 10. 3. Right middle cranial fossa mass with extension through the skull base. Differential considerations include cranial nerve schwannoma, glomus jugulare paraganglioma or aneurysm arising from the skull base segments of the right internal carotid artery. MRI with and without contrast recommended for further characterization. These results were communicated to Dr. Caryl Pina at 12:05 am on 01/13/2019 by text page via the [facility] messaging system. Electronically Signed   By: Deatra Robinson M.D.   On: 01/13/2019 00:05     Vas [place] Lower Extremity Venous (dvt) Result Date: 01/13/2019  Lower Venous Study Indications: Stroke.  Risk Factors: None identified. Comparison Study: No prior studies. Performing Technologist: Chanda Busing RVT  Examination Guidelines: A complete evaluation includes B-mode imaging, spectral Doppler, color Doppler, and power Doppler as needed of all accessible portions of each vessel. Bilateral testing is considered an integral part of a complete examination. Limited examinations for reoccurring indications may be performed as noted.   Summary: Right: There is no evidence of deep vein thrombosis in the lower extremity. No cystic structure found in the popliteal fossa. Left: There is no evidence of deep vein thrombosis in the lower extremity. No cystic structure found in the popliteal fossa.  *See table(s) above for measurements and observations. Electronically signed by Lemar Livings MD on 01/13/2019 at 3:53:42 PM.    Final     12-lead ECG SR/SB All prior EKG's in EPIC reviewed with no documented atrial fibrillation  Telemetry SB, SR, infrequent PVCs, one 6beat NSVT  Assessment and Plan:  1. Cryptogenic stroke The patient presents with cryptogenic stroke.  The patient has a TEE planned for this AM.  I spoke at length with the patient about monitoring for afib with either a 30 day event monitor or an implantable loop recorder.  Risks, benefits,  and alteratives to implantable loop recorder were discussed with the patient today.   At this time, the patient is very clear in their decision to proceed with implantable loop recorder.   Wound care was reviewed with the patient (keep incision clean and dry  for 3 days).  Wound check will be scheduled for the patient  Please call with questions.  Renee Norberto Sorenson, PA-C 01/14/2019  EP Attending  Patient seen and examined. Agree with above. The patient has sustained a cryptogenic stroke. I have reviewed the treatment options with the patient and the indications/risks/benefits/goals/expectations of ILR insertion were reviewed and he wishes to proceed.  Leonia Reeves.D.

## 2019-01-14 NOTE — Progress Notes (Signed)
Unable to pass TEE probe without anesthesia support. Discussed with neurology.  Will not pursue TEE given TCD and LE venous studies negative, asked to proceed with loop implant.  Tommye Standard, PA-C

## 2019-01-14 NOTE — Discharge Instructions (Signed)
Heart monitor implant site wound care instructions °Keep incision clean and dry for 3 days. °You can remove outer dressing tomorrow. °Leave steri-strips (little pieces of tape) on until seen in the office for wound check appointment. °Call the office ([phone]) for redness, drainage, swelling, or fever. ° °
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
## 2019-01-15 ENCOUNTER — Other Ambulatory Visit (HOSPITAL_COMMUNITY): Payer: Self-pay | Admitting: Interventional Radiology

## 2019-01-15 ENCOUNTER — Encounter (HOSPITAL_COMMUNITY): Payer: Self-pay | Admitting: Internal Medicine

## 2019-01-15 DIAGNOSIS — I639 Cerebral infarction, unspecified: Secondary | ICD-10-CM

## 2019-01-15 LAB — CARDIOLIPIN ANTIBODIES, IGG, IGM, IGA
Anticardiolipin IgA: <9 APL U/mL (ref 0–11)
Anticardiolipin IgG: <9 GPL U/mL (ref 0–14)
Anticardiolipin IgM: <9 MPL U/mL (ref 0–12)

## 2019-01-15 LAB — LUPUS ANTICOAGULANT PANEL
DRVVT: 36.4 s (ref 0.0–47.0)
PTT Lupus Anticoagulant: 30.9 s (ref 0.0–51.9)

## 2019-01-24 ENCOUNTER — Encounter (HOSPITAL_COMMUNITY): Payer: Self-pay | Admitting: Interventional Radiology

## 2019-01-27 ENCOUNTER — Telehealth: Payer: Self-pay

## 2019-01-27 NOTE — Telephone Encounter (Signed)
I receive call from Dr. Feliberto Harts oral surgery. He was told by pt that he had a stroke and is only on aspirin and was recently discharge from hospital. The patient has a tooth abscess, and needs a biopsy on it. He wanted to know if the pt can have local anesthesia and have a biopsy if necessary. I stated per the notes pt is on brilinita for the stroke. He stated pt only told him he was on aspirin. He needs to hear from the neurologist if pt can have this done. He gave number of [phone].

## 2019-01-27 NOTE — Telephone Encounter (Signed)
    COVID-19 Pre-Screening Questions:  . In the past 7 to 10 days have you had a cough,  shortness of breath, headache, congestion, fever (100 or greater) body aches, chills, sore throat, or sudden loss of taste or sense of smell? no . Have you been around anyone with known Covid 19. no . Have you been around anyone who is awaiting Covid 19 test results in the past 7 to 10 days?no . Have you been around anyone who has been exposed to Covid 19, or has mentioned symptoms of Covid 19 within the past 7 to 10 days? no  If you have any concerns/questions about symptoms patients report during screening (either on the phone or at threshold). Contact the provider seeing the patient or DOD for further guidance.  If neither are available contact a member of the leadership team.          Pt answered no to all covid-19 prescreening questions. I asked the pt to wear a mask to his appointment if he has one. I let him know that we are reducing the number of people coming into the office and if he can physically come alone to please do so. I told him if anything changes between now and his appointment time to please let us know. The pt verbalized understanding.

## 2019-01-28 ENCOUNTER — Ambulatory Visit (INDEPENDENT_AMBULATORY_CARE_PROVIDER_SITE_OTHER): Payer: Commercial Managed Care - PPO | Admitting: *Deleted

## 2019-01-28 ENCOUNTER — Other Ambulatory Visit: Payer: Self-pay

## 2019-01-28 DIAGNOSIS — I639 Cerebral infarction, unspecified: Secondary | ICD-10-CM

## 2019-01-28 LAB — CUP PACEART INCLINIC DEVICE CHECK
Date Time Interrogation Session: 20200804185958
Implantable Pulse Generator Implant Date: 20200721

## 2019-01-28 NOTE — Progress Notes (Signed)
ILR wound check in clinic. Steri strips removed. Wound well healed. Home monitor transmitting nightly. No episodes. Pause and brady detection reprogrammed off per cryptogenic stroke protocol. Questions answered. ROV with GT PRN.

## 2019-01-29 NOTE — Telephone Encounter (Signed)
I called and left message for Dr Romie Minus to call me back on my cell phone

## 2019-01-29 NOTE — Telephone Encounter (Signed)
Dr Romie Minus has called stating that he has yet to hear back from Dr Leonie Man.  RN Stanton Kidney C advised that Dr Clydene Fake cellular # could be provided to Dr Romie Minus..  Dr Clydene Fake celluar # was given to [facility] at the office for Dr. Feliberto Harts oral surgery.  This is Pharmacist, hospital

## 2019-01-30 ENCOUNTER — Other Ambulatory Visit: Payer: Self-pay

## 2019-01-30 NOTE — Patient Outreach (Signed)
[place] [facility]) Care Management  01/30/2019  Alex Heath 10/11/1955 [id number]    EMMI-Stroke RED ON EMMI ALERT Day # 13 Date: 01/29/2019 Red Alert Reason: " Went to follow up appt? No  Scheduled follow up appt? No"   Outreach attempt # 1 to patient. No answer. RN CM left HIPAA compliant voicemail message along with contact info.     Plan: RN CM will make outreach attempt to patient within 3-4 business days. RN CM will send unsuccessful outreach letter to patient.  Enzo Montgomery, RN,BSN,CCM [place] [facility] Telephonic Care Management Coordinator Direct Phone: [phone] Toll Free: [phone] Fax: [phone]

## 2019-01-31 ENCOUNTER — Other Ambulatory Visit: Payer: Self-pay

## 2019-01-31 NOTE — Patient Outreach (Signed)
[place] [facility]) Care Management  01/31/2019  Alex Heath [birth year] [id number]   EMMI-Stroke RED ON EMMI ALERT Day # 13 Date: 01/29/2019 Red Alert Reason: " Went to follow up appt? No  Scheduled follow up appt? No"    Outreach attempt #2 to patient. Spoke with patient who denies any acute issues or concerns at this time. Reviewed and addressed red alerts with patient. He reports that he has scheduled his follow up appts. He goes to see radiologist on 02/03/2019, cardiology 02/17/19 and neuro on 02/19/19. He voices that he went on 01/28/19 for wound check. He confirms that he has transportation to appt. He lives with supportive spouse. Patient has all his meds in the home and no issues or concerns regarding them. He denies any further RN CM needs or concerns at this time. Patient has completed automated post discharge EMMI calls.    Plan: RN CM will close case as no further interventions needed at this time.   Enzo Montgomery, RN,BSN,CCM [place] Management Telephonic Care Management Coordinator Direct Phone: [phone] Toll Free: [phone] Fax: [phone]

## 2019-02-03 ENCOUNTER — Ambulatory Visit (HOSPITAL_COMMUNITY)
Admission: RE | Admit: 2019-02-03 | Discharge: 2019-02-03 | Disposition: A | Discharge status: Home / Self Care | Payer: Commercial Managed Care - PPO | Source: Ambulatory Visit | Attending: Student | Admitting: Student

## 2019-02-03 ENCOUNTER — Other Ambulatory Visit: Payer: Self-pay

## 2019-02-03 DIAGNOSIS — I639 Cerebral infarction, unspecified: Secondary | ICD-10-CM

## 2019-02-03 DIAGNOSIS — Z0289 Encounter for other administrative examinations: Secondary | ICD-10-CM

## 2019-02-03 NOTE — H&P (Signed)
Referring Physician(s): . Delia HeadyPramod Sethi  Supervising Physician: Julieanne Cottoneveshwar, Sanjeev  Patient Status:  Alex Heath  Chief Complaint:  CVA  HPI:   Alex Heath is s/p Right MCA M1 segment occlusion s/p emergent mechanical thrombectomy achieving a TICI 3 revascularization and acute occlusion of right ICA proximal s/p stent assisted angioplasty by Dr. Corliss Skainseveshwar on 01/13/2019  Is here today for follow up.  He is doing very well. His only complaint is some stuffiness in his right ear. This is due to his cholesterol  Granuloma.  He is taking Brilinta BID and an aspirin.  He is eager to return to work.  He tells me he has a virtual appointment with a specialist [facility] tomorrow regarding treatment for his cholesterol granuloma.  Allergies: Penicillins  Medications: Prior to Admission medications   Medication Sig Start Date End Date Taking? Authorizing Provider  aspirin 81 MG chewable tablet Chew 1 tablet (81 mg total) by mouth daily. Or low dose 81 mg enteric coated aspirin 01/14/19   Layne BentonBiby, Sharon L, NP  atorvastatin (LIPITOR) 40 MG tablet Take 1 tablet (40 mg total) by mouth daily at 6 PM. 01/14/19   Layne BentonBiby, Sharon L, NP  ciprofloxacin (CIPRO) 500 MG tablet Take 1 tablet by mouth 2 (two) times daily. 01/23/19   [provider]  ondansetron (ZOFRAN ODT) 8 MG disintegrating tablet Take 1 tablet (8 mg total) by mouth every 8 (eight) hours as needed for nausea or vomiting. 10/26/13   Marisa Severintter, Olga, MD  ticagrelor (BRILINTA) 90 MG TABS tablet Take 1 tablet (90 mg total) by mouth 2 (two) times daily. 01/14/19   Layne BentonBiby, Sharon L, NP  Zinc 100 MG TABS Take 100 mg by mouth daily.    [provider]    Physical Exam Constitutional:      Appearance: Normal appearance.  HENT:     Head: Normocephalic and atraumatic.  Eyes:     Extraocular Movements: Extraocular movements intact.  Cardiovascular:     Rate and Rhythm: Normal rate and regular rhythm.  Pulmonary:   Effort: Pulmonary effort is normal. No respiratory distress.     Breath sounds: Normal breath sounds.  Musculoskeletal: Normal range of motion.  Skin:    General: Skin is warm and dry.  Neurological:     General: No focal deficit present.     Mental Status: He is alert.  Psychiatric:        Mood and Affect: Mood normal.        Behavior: Behavior normal.        Thought Content: Thought content normal.        Judgment: Judgment normal.     Imaging: No results found.  Labs:  CBC: Recent Labs    01/12/19 2340 01/12/19 2347 01/13/19 1018 01/14/19 0115  WBC 9.3  --  12.7* 8.6  HGB 13.9 13.9 11.3* 10.5*  HCT 43.1 41.0 34.5* 33.3*  PLT 196  --  181 175    COAGS: Recent Labs    01/12/19 2340  INR 1.0  APTT 25    BMP: Recent Labs    01/12/19 2340 01/12/19 2347 01/13/19 1018 01/14/19 0115  NA 140 141 138 140  K 4.1 4.1 3.7 3.6  CL 109 110 111 112*  CO2 22  --  19* 21*  GLUCOSE 111* 110* 110* 108*  BUN 16 19 11 9   CALCIUM 9.4  --  8.0* 8.3*  CREATININE 1.21 1.20 1.10 1.11  GFRNONAA >60  --  >  60 >60  GFRAA >60  --  >60 >60    LIVER FUNCTION TESTS: Recent Labs    01/12/19 2340  BILITOT 0.7  AST 26  ALT 27  ALKPHOS 43  PROT 6.9  ALBUMIN 4.1    Assessment and Plan:  Stroke  S/p Right MCA M1 segment occlusion s/p emergent mechanical thrombectomy achieving a TICI 3 revascularization and acute occlusion of right ICA proximal s/p stent assisted angioplasty by Dr. Estanislado Pandy on 01/13/2019.  Will obtain carotid doppler in 3 months and as long as the stent looks good with no in stent re-stenosis, he will be able to hold his Brilinta in case he needs surgery for the cholesterol granuloma. He will need to remain on his aspirin during that time.  Return to work per Neurology, ok to return from our standpoint.   Electronically Signed: Murrell Redden, PA-C 02/03/2019, 12:20 PM    I spent a total of 25 Minutes at the the patient's bedside AND on the patient's  hospital floor or unit, greater than 50% of which was counseling/coordinating care for f/u after carotid stent.

## 2019-02-06 ENCOUNTER — Telehealth: Payer: Self-pay

## 2019-02-06 NOTE — Telephone Encounter (Signed)
I call pt about his disability form for his job. I stated his appt is on 02/11/2019 with Janett Billow NP at [place] for hospital stroke follow up. Pt stated he has been out of work since July 19,2020. I stated the form dates can be done till 02/11/2019 or he can wait till his appt with JEssica stroke NP. Pt stated the form can wait till his appt on 02/11/2019 to see what JEssica NP recommend for return to work status. Pt also paid the 50 dollar fee.

## 2019-02-11 ENCOUNTER — Ambulatory Visit: Payer: Commercial Managed Care - PPO | Admitting: Adult Health

## 2019-02-11 ENCOUNTER — Encounter: Payer: Self-pay | Admitting: Adult Health

## 2019-02-11 ENCOUNTER — Encounter: Payer: Self-pay | Admitting: *Deleted

## 2019-02-11 ENCOUNTER — Other Ambulatory Visit: Payer: Self-pay

## 2019-02-11 VITALS — BP 130/83 | HR 66 | Temp 98.0°F | Ht 66.0 in | Wt 162.0 lb

## 2019-02-11 DIAGNOSIS — E785 Hyperlipidemia, unspecified: Secondary | ICD-10-CM | POA: Diagnosis not present

## 2019-02-11 DIAGNOSIS — I639 Cerebral infarction, unspecified: Secondary | ICD-10-CM | POA: Diagnosis not present

## 2019-02-11 NOTE — Telephone Encounter (Signed)
Disability form done for patient from hospital admission till 02/16/2019.PT was seen today by Janett Billow NP. Pt will return to work 02/17/2019 full time without any restrictions. Form paid for and was given to medical records.

## 2019-02-11 NOTE — Progress Notes (Signed)
[facility] [address] [place]. [place] [id number] [phone]       HOSPITAL FOLLOW UP NOTE  Alex. Alex Heath Date of Birth:  1955-08-01 Medical Record Number:  [id number]   Reason for Referral:  hospital stroke follow up    CHIEF COMPLAINT:  Chief Complaint  Patient presents with   Follow-up    Treatment room,with his wife. Hosp. f/u. Crytogenic stroke. "Doing pretty well. has muscle twitches on his side."    HPI: Alex Heath being seen today for in office hospital follow-up regarding right MCA insular/putament cortical infarct on 01/12/2019.  History obtained from patient, wife and chart review. Reviewed all radiology images and labs personally.  Alex.Alex Heath a 62 y.o.malewith no significant past medical history [facility]/2019 with left facial droop, garbled speech and abnormal gait.  Neurology consulted with stroke work-up completed and found to have right MCA insular/putamen cortical infarct as evidenced on MRI with CTA demonstrating a R MCA/ICA occlusion and sent to IR with TICI 3 revascularization of right M1 and right ICA with stent.  Post IR no hemorrhage Heath edema but did show a ring-enhancing lesion right middle cranial fossa which was also demonstrated on CT and CTA.  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  2D echo normal EF without cardiac source of embolus identified.  Lower extremity venous Dopplers negative for DVT.  TCD with bubble no HITS at rest Heath during Valsalva.  Unable to perform TEE due to inability to sedate and loop recorder placed on 01/14/2019 to assess for potential atrial fibrillation due to cryptogenic etiology.  Hypercoagulable work-up unremarkable.  Discharged on aspirin 81 mg daily and Brilinta  90 mg twice daily post stent.  Initiated high intensity statin for LDL 83.  Other stroke risk factors include EtOH use but no prior history of stroke.  Discharged home in stable condition without therapy needs.  He has been doing well from a neurological standpoint without residual deficits Heath reoccurring of symptoms.  He does endorse mild left upper and lower extremity muscle twitching at rest which was present prior to his stroke with possible slight worsening.  These are not debilitating Heath interfere with activity.  Denies pain.  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 Continues on atorvastatin 40 mg daily without myalgias Continues on aspirin 81 mg and Brilinta 90 mg twice daily without bleeding Heath bruising He did have follow-up with vascular surgery on 02/03/2019 and recommend repeat carotid Doppler in 3 months time Loop recorder has not shown atrial fibrillation thus far Blood pressure today satisfactory 130/83 [facility] granuloma.  Plans on repeating imaging in 3 months and will discuss future treatment options depending on imaging. No further concerns at this time   ROS:   14 system review of systems performed and negative with exception of see HPI  PMH:  Past Medical History:  Diagnosis Date   Hyperlipemia     PSH:  Past Surgical History:  Procedure Laterality Date   IR ANGIO INTRA EXTRACRAN SEL COM CAROTID INNOMINATE UNI L MOD SED  01/13/2019   IR ANGIO VERTEBRAL SEL SUBCLAVIAN INNOMINATE BILAT MOD SED  01/13/2019   IR CT HEAD LTD  01/13/2019   IR INTRAVSC STENT CERV CAROTID W/O EMB-PROT MOD SED INC ANGIO  01/13/2019   IR PERCUTANEOUS ART THROMBECTOMY/INFUSION INTRACRANIAL INC DIAG ANGIO  01/13/2019   LOOP RECORDER INSERTION N/A 01/14/2019    Procedure: LOOP RECORDER INSERTION;  Surgeon: Marinus Mawaylor, Gregg W, MD;  Location: [facility];  Service: Cardiovascular;  Laterality: N/A;   RADIOLOGY WITH ANESTHESIA N/A 01/13/2019   Procedure: RADIOLOGY WITH ANESTHESIA;  Surgeon: Julieanne Cottoneveshwar, Sanjeev, MD;  Location: [facility];  Service: Radiology;  Laterality: N/A;    Social History:  Social History   Socioeconomic History   Marital status: Married    Spouse name: Not on file   Number of children: Not on file   Years of education: Not on file   Highest education level: Not on file  Occupational History   Not on file  Social Needs   Financial resource strain: Not on file   Food insecurity    Worry: Not on file    Inability: Not on file   Transportation needs    Medical: Not on file    Non-medical: Not on file  Tobacco Use   Smoking status: Never Smoker   Smokeless tobacco: Never Used  Substance and Sexual Activity   Alcohol use: Yes    Comment: occassionally   Drug use: Never   Sexual activity: Not on file  Lifestyle   Physical activity    Days per week: Not on file    Minutes per session: Not on file   Stress: Not on file  Relationships   Social connections    Talks on phone: Not on file    Gets together: Not on file    Attends religious service: Not on file    Active member of club Heath organization: Not on file    Attends meetings of clubs Heath organizations: Not on file    Relationship status: Not on file   Intimate partner violence    Fear of current Heath ex partner: Not on file    Emotionally abused: Not on file    Physically abused: Not on file    Forced sexual activity: Not on file  Other Topics Concern   Not on file  Social History Narrative   Not on file    Family History: No family history on file.  Medications:   Current Outpatient Medications on File Prior to Visit  Medication Sig Dispense Refill   aspirin 81 MG chewable tablet Chew 1 tablet (81 mg total) by mouth daily. Heath low  dose 81 mg enteric coated aspirin     atorvastatin (LIPITOR) 40 MG tablet Take 1 tablet (40 mg total) by mouth daily at 6 PM. 30 tablet 2   ticagrelor (BRILINTA) 90 MG TABS tablet Take 1 tablet (90 mg total) by mouth 2 (two) times daily. 60 tablet 2   Zinc 100 MG TABS Take 100 mg by mouth daily.     No current facility-administered medications on file prior to visit.     Allergies:   Allergies  Allergen Reactions   Penicillins Other (See Comments)    childhood     Physical Exam  Vitals:   02/11/19 0955  BP: 130/83  Pulse: 66  Temp: 98 F (36.7 C)  Weight: 162 lb (73.5 kg)  Height: 5\' 6"  (1.676 m)   Body mass index is 26.15 kg/m. No exam data present  [facility] 02/11/2019  Decreased Interest 0  Down, Depressed, Hopeless 0  PHQ - 2 Score 0     General: well developed, well nourished,  pleasant middle-age Caucasian male, seated, in no evident distress Head: head normocephalic and atraumatic.   Neck: supple with no carotid Heath supraclavicular bruits Cardiovascular: regular rate and rhythm, no murmurs Musculoskeletal: no deformity Skin:  no rash/petichiae Vascular:  Normal pulses all extremities   Neurologic Exam Mental Status: Awake and fully alert. Oriented to place and time. Recent and remote memory intact. Attention span, concentration and fund of knowledge appropriate. Mood and affect appropriate.  Cranial Nerves: Fundoscopic exam reveals sharp disc margins. Pupils equal, briskly reactive to light. Extraocular movements full without nystagmus. Visual fields full to confrontation. Hearing intact. Facial sensation intact. Face, tongue, palate moves normally and symmetrically.  Motor: Normal bulk and tone. Normal strength in all tested extremity muscles.  Sensory.: intact to touch , pinprick , position and vibratory sensation.  Coordination: Rapid alternating movements normal in all extremities. Finger-to-nose and heel-to-shin performed accurately  bilaterally. Gait and Station: Arises from chair without difficulty. Stance is normal. Gait demonstrates normal stride length and balance Reflexes: 1+ and symmetric. Toes downgoing.     NIHSS  0 Modified Rankin  0   Diagnostic Data (Labs, Imaging, Testing)  Ct Head Code Stroke Wo Contrast 01/13/2019 1. No intracranial hemorrhage. 2. ASPECTS is 10. 3. Right middle cranial fossa mass with extension through the skull base. Differential considerations include cranial nerve schwannoma, glomus jugulare paraganglioma Heath aneurysm arising from the skull base segments of the right internal carotid artery.   Ct Code Stroke Cta Head W/wo Contrast Ct Code Stroke Cta Neck W/wo Contrast Ct Code Stroke Cta Cerebral Perfusion W/wo Contrast 01/13/2019 1. 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 2. 100 mL ischemic penumbra within the right MCA territory. ASPECTS 10. 3. 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   Ir Percutaneous Art Thrombectomy/infusion Intracranial Inc Diag Angio Ir Intravsc Stent Cerv Carotid W/o Emb-prot Mod Sed Ir Ct Head Ltd 01/13/2019 Status post endovascular complete revascularization of occluded right middle cerebral artery proximal M1 segment with 1 pass using the 5 mm x 33 mm Embotrap retrieval device with a Penumbra aspiration achieving a TICI 3 revascularization. Status post endovascular revascularization of symptomatic acute occlusion of the proximal right internal carotid artery with stent assisted angioplasty. PLAN: Follow-up in clinic 4 weeks post discharge.   [facility] Contrast 01/13/2019 1. Acute/subacute nonhemorrhagic cortical infarcts involving the anterior right insular cortex and lateral aspect of the right putamen. 2. No other significant right MCA  territory infarct. 3. 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 There is no definite enhancement. There is some scalloping of the bone on the CT scan. There is no evidence for vascular lesion on the angiogram.   Vas [place] Transcranial Doppler W Bubbles 01/13/2019 No HITS at rest Heath during Valsalva. No apparent PFO.  Vas [place] Lower Extremity Venous (dvt) 01/13/2019 Right: There is no evidence of deep vein thrombosis in the lower extremity. No cystic structure found in the popliteal fossa.  Left: There is no evidence of deep vein thrombosis in the lower extremity. No cystic structure found in the popliteal fossa.   2D Echocardiogram 1. The left ventricle has normal systolic function  with an ejection fraction of 60-65%. The cavity size was normal. Left ventricular diastolic parameters were normal. 2. The right ventricle has normal systolic function. The cavity was normal. 3. The aortic root is normal in size and structure. 4. The mitral valve is grossly normal. 5. The tricuspid valve is grossly normal. 6. Normal LV function; no significant valvular heart disease.  TEE 01/14/2019 Unable to adequately sedate for procedure  Loop inserted 01/14/2019 w/o complication   ASSESSMENT: Alex DomKenneth Heath is a 63 y.o. year old male presented with left facial droop, garbled speech and abnormal gait on 01/12/2019 with completed stroke work-up revealing right MCA insular/putman cortical infarct s/p IR with revascularization right M1 and right ICA with stent secondary to embolic versus chiropractic trauma.  Loop recorder placed for long-term monitoring of potential atrial fibrillation.  Vascular risk factors include HLD and EtOH use.  Recovered well from a stroke standpoint without residual deficits.    PLAN:  1. Right MCA infarct: Continue aspirin 81 mg daily and Brilinta (ticagrelor) 90 mg bid  and Lipitor for secondary  stroke prevention. Maintain strict control of hypertension with blood pressure goal below 130/90, diabetes with hemoglobin A1c goal below 6.5% and cholesterol with LDL cholesterol (bad cholesterol) goal below 70 mg/dL.  I also advised the patient to eat a healthy diet with plenty of whole grains, cereals, fruits and vegetables, exercise regularly with at least 30 minutes of continuous activity daily and maintain ideal body weight.  Continue to monitor loop recorder for atrial fibrillation. 2. 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 HLD: Advised to continue current treatment regimen along with continued follow-up with PCP for future prescribing and monitoring of lipid panel 5. As he has recovered well from a stroke standpoint, he was cleared to return to work at this time.  He has been out of work since hospital discharge therefore ST disability forms will be completed 6. He was provided additional information regarding participation in [place] trial by our research team    Follow up in 4 months Heath call earlier if needed   Greater than 50% of time during this prolonged 60 minute visit was spent on counseling, explanation of diagnosis of right MCA infarct, discussion regarding right MCA/ICA occlusion, discussion regarding loop recorder placement to assess for potential atrial fibrillation, discussion regarding [place] trial, reviewing risk factor management of HLD, planning of further management along with potential future management, and discussion with patient and family answering all questions.    Ihor AustinJessica McCue, AGNP-BC  [facility] [address] [place], [place] [id number]  Phone [phone] Fax  [phone] Note: This document was prepared with digital dictation and possible smart phrase technology. Any transcriptional errors that result from this process are unintentional.

## 2019-02-11 NOTE — Patient Instructions (Signed)
Continue aspirin 81 mg daily and Brilinta (ticagrelor) 90 mg bid  and Lipitor for secondary stroke prevention  Continue to follow up with PCP regarding cholesterol and blood pressure management   Continue to monitor loop recorder for atrial fibrillation -he will be notified with any findings by cardiology  Continue to follow with vascular surgery with repeat imaging in 3 months  As he recovered well from a stroke standpoint, you can return to work full-time without restrictions.  You can also return to all prior activities including low impact exercising  Continue to stay active and maintain a healthy diet  Continue to monitor blood pressure at home  Information was provided regarding Remi Haggard trial.  Consider participation in contact [place] research team if interested  Maintain strict control of hypertension with blood pressure goal below 130/90, diabetes with hemoglobin A1c goal below 6.5% and cholesterol with LDL cholesterol (bad cholesterol) goal below 70 mg/dL. I also advised the patient to eat a healthy diet with plenty of whole grains, cereals, fruits and vegetables, exercise regularly and maintain ideal body weight.  Followup in the future with me in 4 months or call earlier if needed       Thank you for coming to see [place] at [facility]. I hope we have been able to provide you high quality care today.  You may receive a patient satisfaction survey over the next few weeks. We would appreciate your feedback and comments so that we may continue to improve ourselves and the health of our patients.

## 2019-02-11 NOTE — Progress Notes (Signed)
I agree with the above plan 

## 2019-02-12 ENCOUNTER — Encounter: Payer: Self-pay | Admitting: Adult Health

## 2019-02-12 ENCOUNTER — Telehealth: Payer: Self-pay | Admitting: *Deleted

## 2019-02-12 NOTE — Telephone Encounter (Signed)
Disability form fax to [phone] and [phone]. Form fax to both numbers and confirmed.

## 2019-02-12 NOTE — Telephone Encounter (Signed)
Pt disability leave form faxed on  02/12/19 to [phone] or [phone]

## 2019-02-13 ENCOUNTER — Telehealth: Payer: Self-pay | Admitting: *Deleted

## 2019-02-13 NOTE — Telephone Encounter (Signed)
R/c release from pt today. Pt [place] form on [employer].

## 2019-02-17 ENCOUNTER — Ambulatory Visit (INDEPENDENT_AMBULATORY_CARE_PROVIDER_SITE_OTHER): Payer: Commercial Managed Care - PPO | Admitting: *Deleted

## 2019-02-17 ENCOUNTER — Telehealth: Payer: Self-pay | Admitting: Adult Health

## 2019-02-17 DIAGNOSIS — I639 Cerebral infarction, unspecified: Secondary | ICD-10-CM

## 2019-02-17 NOTE — Telephone Encounter (Signed)
Provided letter for patient in order to undergo physical activity in his local gym as this will help benefit him for secondary stroke prevention and ongoing stroke recovery

## 2019-02-18 ENCOUNTER — Encounter: Payer: Self-pay | Admitting: *Deleted

## 2019-02-18 LAB — CUP PACEART REMOTE DEVICE CHECK
Date Time Interrogation Session: 20200823183931
Implantable Pulse Generator Implant Date: 20200721

## 2019-02-18 NOTE — Telephone Encounter (Signed)
I called pt and relayed that have letter for him.  I can email to him and confirmed his email [email or web address].  email sent with letter attachment.

## 2019-02-19 ENCOUNTER — Inpatient Hospital Stay: Payer: Commercial Managed Care - PPO | Admitting: Adult Health

## 2019-02-24 NOTE — Progress Notes (Signed)
Carelink Summary Report / Loop Recorder 

## 2019-03-21 ENCOUNTER — Ambulatory Visit (INDEPENDENT_AMBULATORY_CARE_PROVIDER_SITE_OTHER): Payer: Commercial Managed Care - PPO | Admitting: *Deleted

## 2019-03-21 DIAGNOSIS — I639 Cerebral infarction, unspecified: Secondary | ICD-10-CM | POA: Diagnosis not present

## 2019-03-25 LAB — CUP PACEART REMOTE DEVICE CHECK
Date Time Interrogation Session: 20200928195838
Implantable Pulse Generator Implant Date: 20200721

## 2019-03-26 NOTE — Progress Notes (Signed)
Carelink Summary Report / Loop Recorder 

## 2019-04-03 ENCOUNTER — Encounter: Payer: Self-pay | Admitting: Adult Health

## 2019-04-10 ENCOUNTER — Other Ambulatory Visit (HOSPITAL_COMMUNITY): Payer: Self-pay | Admitting: Interventional Radiology

## 2019-04-10 DIAGNOSIS — I639 Cerebral infarction, unspecified: Secondary | ICD-10-CM

## 2019-04-17 ENCOUNTER — Telehealth (HOSPITAL_COMMUNITY): Payer: Self-pay

## 2019-04-17 NOTE — Telephone Encounter (Signed)
Pt called needing a refill on Brilinta. Spoke to pharmacy and they are sending over a refill request to [phone]. AW

## 2019-04-18 ENCOUNTER — Telehealth: Payer: Self-pay | Admitting: Student

## 2019-04-18 NOTE — Telephone Encounter (Signed)
NIR.  Received call from patient and fax request from pharmacy for medication refill (Brilinta). Phoned prescription into [facility] in [place], [place] [phone]) at [phone] to fill prescription- Brilinta 90 mg tablets, take one tablet by mouth twice daily, dispense 60 tablets with 3 refills.   Bea Graff Louk, PA-C 04/18/2019, 2:57 PM

## 2019-04-23 ENCOUNTER — Other Ambulatory Visit: Payer: Self-pay

## 2019-04-23 NOTE — Patient Outreach (Signed)
First attempt to obtain mRs. No answer. Left message for return call.  

## 2019-04-27 LAB — CUP PACEART REMOTE DEVICE CHECK
Date Time Interrogation Session: 20201031195733
Implantable Pulse Generator Implant Date: 20200721

## 2019-04-28 ENCOUNTER — Ambulatory Visit (INDEPENDENT_AMBULATORY_CARE_PROVIDER_SITE_OTHER): Payer: Commercial Managed Care - PPO | Admitting: *Deleted

## 2019-04-28 DIAGNOSIS — I639 Cerebral infarction, unspecified: Secondary | ICD-10-CM

## 2019-04-29 ENCOUNTER — Other Ambulatory Visit: Payer: Self-pay

## 2019-04-29 NOTE — Patient Outreach (Signed)
Second attempt to obtain mRs. No answer. Called wife and patient. Will try again this afternoon.

## 2019-04-29 NOTE — Patient Outreach (Signed)
3 outreach attempts were completed to obtain mRs. mRs could not be obtained because patient never returned my calls. mRs=7 

## 2019-05-05 ENCOUNTER — Ambulatory Visit (HOSPITAL_COMMUNITY)
Admission: RE | Admit: 2019-05-05 | Discharge: 2019-05-05 | Disposition: A | Discharge status: Home / Self Care | Payer: Commercial Managed Care - PPO | Source: Ambulatory Visit | Attending: Interventional Radiology | Admitting: Interventional Radiology

## 2019-05-05 ENCOUNTER — Other Ambulatory Visit: Payer: Self-pay

## 2019-05-05 DIAGNOSIS — I639 Cerebral infarction, unspecified: Secondary | ICD-10-CM | POA: Diagnosis not present

## 2019-05-06 ENCOUNTER — Telehealth (HOSPITAL_COMMUNITY): Payer: Self-pay

## 2019-05-06 NOTE — Telephone Encounter (Signed)
Pt agreed to f/u in 6 months with US carotid. He also had questions about holding his Brilinta for his granuloma surgery. Dr. Estanislado Pandy will agree to holding blood thinner but wants a written request from surgeon so that he can sign off on this. Pt will get request faxed over once surgery is scheduled. AW

## 2019-05-20 ENCOUNTER — Telehealth: Payer: Self-pay | Admitting: Adult Health

## 2019-05-20 MED ORDER — ATORVASTATIN CALCIUM 40 MG PO TABS: 40.0000 mg | ORAL_TABLET | Freq: Every day | ORAL | 0 refills | Status: AC

## 2019-05-20 NOTE — Telephone Encounter (Signed)
1) Medication(s) Requested (by name): atorvastatin 2) Pharmacy of Choice:  [id number] [place], [place]

## 2019-05-20 NOTE — Telephone Encounter (Signed)
Spoke to pt and relayed that willget atorvastatin from pcp from now on.  Has f/u in jan/feb, I relayed will fill for 90day supply and then get pcp to fill.  He verbalized understanding.

## 2019-05-26 NOTE — Progress Notes (Signed)
Carelink Summary Report / Loop Recorder 

## 2019-05-29 ENCOUNTER — Ambulatory Visit (INDEPENDENT_AMBULATORY_CARE_PROVIDER_SITE_OTHER): Payer: Commercial Managed Care - PPO | Admitting: *Deleted

## 2019-05-29 DIAGNOSIS — I639 Cerebral infarction, unspecified: Secondary | ICD-10-CM

## 2019-05-29 LAB — CUP PACEART REMOTE DEVICE CHECK
Date Time Interrogation Session: 20201203153357
Implantable Pulse Generator Implant Date: 20200721

## 2019-05-31 ENCOUNTER — Encounter (HOSPITAL_COMMUNITY): Payer: Self-pay | Admitting: Emergency Medicine

## 2019-05-31 ENCOUNTER — Emergency Department (HOSPITAL_COMMUNITY)
Admission: EM | Admit: 2019-05-31 | Discharge: 2019-05-31 | Disposition: A | Discharge status: Home / Self Care | Payer: Commercial Managed Care - PPO | Attending: Emergency Medicine | Admitting: Emergency Medicine

## 2019-05-31 ENCOUNTER — Other Ambulatory Visit: Payer: Self-pay

## 2019-05-31 DIAGNOSIS — R040 Epistaxis: Secondary | ICD-10-CM | POA: Insufficient documentation

## 2019-05-31 DIAGNOSIS — Z95818 Presence of other cardiac implants and grafts: Secondary | ICD-10-CM | POA: Diagnosis not present

## 2019-05-31 DIAGNOSIS — R509 Fever, unspecified: Secondary | ICD-10-CM | POA: Diagnosis not present

## 2019-05-31 DIAGNOSIS — Z7982 Long term (current) use of aspirin: Secondary | ICD-10-CM | POA: Diagnosis not present

## 2019-05-31 DIAGNOSIS — Z79899 Other long term (current) drug therapy: Secondary | ICD-10-CM | POA: Insufficient documentation

## 2019-05-31 DIAGNOSIS — R030 Elevated blood-pressure reading, without diagnosis of hypertension: Secondary | ICD-10-CM | POA: Insufficient documentation

## 2019-05-31 DIAGNOSIS — M791 Myalgia, unspecified site: Secondary | ICD-10-CM | POA: Diagnosis not present

## 2019-05-31 HISTORY — DX: Cerebral infarction, unspecified: I63.9

## 2019-05-31 MED ORDER — OXYMETAZOLINE HCL 0.05 % NA SOLN
1.0000 | Freq: Once | NASAL | Status: AC
  Administered 2019-05-31: 1 via NASAL
  Filled 2019-05-31: qty 30

## 2019-05-31 MED ORDER — SILVER NITRATE-POT NITRATE 75-25 % EX MISC
1.0000 | Freq: Once | CUTANEOUS | Status: AC
  Administered 2019-05-31: 23:00:00 1 via TOPICAL
  Filled 2019-05-31: qty 1

## 2019-05-31 NOTE — Discharge Instructions (Addendum)
You can use saline nasal sprays to help keep your mucus membranes moist and prevent recurrence.  If your nosebleed returns, you can use Afrin and apply pressure to your septum.  That persists, please return back to the emergency department for evaluation. Follow-up with your primary care provider for blood pressure recheck as it is elevated today.

## 2019-05-31 NOTE — ED Triage Notes (Signed)
C/o R nostril bleeding x 2-3 hours.  Takes Brilinta.

## 2019-05-31 NOTE — ED Provider Notes (Signed)
[facility] Provider Note   CSN: [id number] Arrival date & time: 05/31/19  1735     History   Chief Complaint Chief Complaint  Patient presents with   Epistaxis    HPI Alex Heath is a 63 y.o. male w PMHx stroke on Brillinta, presenting to the ED with complaint of nose bleed from the right nare that began around 2:30-3pm today. He states he had sudden onset of bleeding. It was heavy at times. He applied pressure to his septum without relief. He states since he got to his room in the ED, his nose bleed has stopped. He does not have blood running down his throat right now either. He denies recent illness, congestion, seasonal allergies, nasal trauma. He does states he had some mild body aches yesterday and today, and noted intermittent low grade fever with tmax of 99.49F. No sick contacts. No other symptoms reported.  He is noted to be hypertensive on arrival, no hx of HTN. No other assoc symptoms.     The history is provided by the patient.    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    Past Surgical History:  Procedure Laterality Date   IR ANGIO INTRA EXTRACRAN SEL COM CAROTID INNOMINATE UNI L MOD SED  01/13/2019   IR ANGIO VERTEBRAL SEL SUBCLAVIAN INNOMINATE BILAT MOD SED  01/13/2019   IR CT HEAD LTD  01/13/2019   IR INTRAVSC STENT CERV CAROTID W/O EMB-PROT MOD SED INC ANGIO  01/13/2019   IR PERCUTANEOUS ART THROMBECTOMY/INFUSION INTRACRANIAL INC DIAG ANGIO  01/13/2019   LOOP RECORDER INSERTION N/A 01/14/2019   Procedure: LOOP RECORDER INSERTION;  Surgeon: Marinus Mawaylor, Gregg W, MD;  Location: [facility] INVASIVE  CV LAB;  Service: Cardiovascular;  Laterality: N/A;   RADIOLOGY WITH ANESTHESIA N/A 01/13/2019   Procedure: RADIOLOGY WITH ANESTHESIA;  Surgeon: Julieanne Cottoneveshwar, Sanjeev, MD;  Location: [facility] OR;  Service: Radiology;  Laterality: N/A;        Home Medications    Prior to Admission medications   Medication Sig Start Date End Date Taking? Authorizing Provider  acetaminophen (TYLENOL) 500 MG tablet Take 1,000 mg by mouth every 6 (six) hours as needed for fever or headache (pain).   Yes [provider]  aspirin EC 81 MG tablet Take 81 mg by mouth daily.   Yes [provider]  atorvastatin (LIPITOR) 40 MG tablet Take 1 tablet (40 mg total) by mouth daily at 6 PM. Patient taking differently: Take 40 mg by mouth at bedtime.  05/20/19  Yes McCue, Shanda BumpsJessica, NP  ticagrelor (BRILINTA) 90 MG TABS tablet Take 1 tablet (90 mg total) by mouth 2 (two) times daily. 01/14/19  Yes Layne BentonBiby, Sharon L, NP  Zinc 50 MG TABS Take 50 mg by mouth 2 (two) times daily.    Yes [provider]  aspirin 81 MG chewable tablet Chew 1 tablet (81 mg total) by mouth daily. Or low dose 81 mg enteric coated aspirin Patient not taking: Reported on 05/31/2019 01/14/19   Layne BentonBiby, Sharon L, NP    Family History No family history on file.  Social History Social History  Tobacco Use   Smoking status: Never Smoker   Smokeless tobacco: Never Used  Substance Use Topics   Alcohol use: Yes    Comment: occassionally   Drug use: Never     Allergies   Penicillins   Review of Systems Review of Systems  Constitutional: Positive for fever.  HENT: Positive for nosebleeds.   Musculoskeletal: Positive for myalgias.  Hematological: Bruises/bleeds easily.  All other systems reviewed and are negative.    Physical Exam Updated Vital Signs BP (!) 149/86    Pulse 69    Temp 98.3 F (36.8 C) (Oral)    Resp 13    SpO2 99%   Physical Exam Vitals signs and nursing note reviewed.  Constitutional:      General: He  is not in acute distress.    Appearance: He is well-developed. He is not ill-appearing.  HENT:     Head: Normocephalic and atraumatic.     Nose:     Comments: Coagulated blood present in b/l nares. Left septum appears normal. Right septum with blood clot present. Not actively bleeding. No blood in pharynx.  Eyes:     Conjunctiva/sclera: Conjunctivae normal.  Cardiovascular:     Rate and Rhythm: Normal rate and regular rhythm.  Pulmonary:     Effort: Pulmonary effort is normal.     Breath sounds: Normal breath sounds.  Abdominal:     Palpations: Abdomen is soft.  Skin:    General: Skin is warm.  Neurological:     Mental Status: He is alert.  Psychiatric:        Behavior: Behavior normal.      ED Treatments / Results  Labs (all labs ordered are listed, but only abnormal results are displayed) Labs Reviewed - No data to display  EKG None  Radiology No results found.  Procedures .Epistaxis Management  Date/Time: 05/31/2019 10:22 PM Performed by: Heleena Miceli, [place] N, PA-C Authorized by: Aryahi Denzler, [place] N, PA-C   Consent:    Consent obtained:  Verbal   Consent given by:  Patient   Risks discussed:  Pain and nasal injury   Alternatives discussed:  Alternative treatment Anesthesia (see MAR for exact dosages):    Anesthesia method:  None Procedure details:    Treatment site:  R septum   Treatment method:  Silver nitrate   Treatment complexity:  Limited   Treatment episode: initial   Post-procedure details:    Assessment:  Bleeding stopped   Patient tolerance of procedure:  Tolerated well, no immediate complications   (including critical care time)  Medications Ordered in ED Medications  oxymetazoline (AFRIN) 0.05 % nasal spray 1 spray (has no administration in time range)  silver nitrate applicators applicator 1 Stick (has no administration in time range)     Initial Impression / Assessment and Plan / ED Course  I have reviewed the triage vital signs and the  nursing notes.  Pertinent labs & imaging results that were available during my care of the patient were reviewed by me and considered in my medical decision making (see chart for details).  Clinical Course as of May 30 2221  Sat May 31, 2019  2026 Patient reevaluated, systolic BP in the 150s.  Continues to have absence of bleeding.  Will discharge with PCP follow-up and return precautions.  Patient agreeable to plan and safe for discharge.   [JR]  2120 Upon discharge, nose bleed restarted. Will adminster afrin and attempt cautery with silver nitrate   [JR]  2140 Afrin administered  and silver nitrate applied to 2 small areas in anterior septum. Pt applying pressure, will re-evaluation shortly.   [JR]    Clinical Course User Index [JR] Maor Meckel, [place] N, PA-C       Patient on Brilinta status post recent stroke, presenting with epistaxis in the right nare.  He is also noted to be hypertensive on arrival which gradually improved throughout ED stay.  On initial evaluation epistaxis had resolved on its own, however as pt was preparing for discharge patient stood up and nose began to bleed again.  Afrin was administered and small bleed was visualized in the anterior septum which was cauterized with silver nitrate, then pressure was applied.  On reevaluation epistaxis had stopped and patient states he is ready for discharge.  Encouraged PCP follow-up for blood pressure recheck.  Symptomatic management at home and strict return precautions.  Patient is agreeable plan of stay for discharge.  Pt discussed with Dr. Rex Kras.  Discussed results, findings, treatment and follow up. Patient advised of return precautions. Patient verbalized understanding and agreed with plan. '  Final Clinical Impressions(s) / ED Diagnoses   Final diagnoses:  Right-sided epistaxis    ED Discharge Orders    None       Cobie Leidner, [place] N, PA-C 05/31/19 2224    Little, Wenda Overland, MD 06/04/19 1352

## 2019-06-04 ENCOUNTER — Ambulatory Visit: Payer: Commercial Managed Care - PPO | Admitting: Adult Health

## 2019-06-09 ENCOUNTER — Other Ambulatory Visit: Payer: Self-pay | Admitting: Medical

## 2019-06-09 DIAGNOSIS — H748X1 Other specified disorders of right middle ear and mastoid: Secondary | ICD-10-CM

## 2019-06-09 DIAGNOSIS — I63511 Cerebral infarction due to unspecified occlusion or stenosis of right middle cerebral artery: Secondary | ICD-10-CM

## 2019-06-24 NOTE — Progress Notes (Signed)
ILR remote 

## 2019-06-30 ENCOUNTER — Ambulatory Visit (INDEPENDENT_AMBULATORY_CARE_PROVIDER_SITE_OTHER): Payer: Commercial Managed Care - PPO | Admitting: *Deleted

## 2019-06-30 DIAGNOSIS — I639 Cerebral infarction, unspecified: Secondary | ICD-10-CM | POA: Diagnosis not present

## 2019-06-30 LAB — CUP PACEART REMOTE DEVICE CHECK
Date Time Interrogation Session: 20210104001230
Implantable Pulse Generator Implant Date: 20200721

## 2019-07-02 ENCOUNTER — Other Ambulatory Visit: Payer: Self-pay | Admitting: Medical

## 2019-07-07 ENCOUNTER — Ambulatory Visit
Admission: RE | Admit: 2019-07-07 | Discharge: 2019-07-07 | Disposition: A | Discharge status: Home / Self Care | Payer: Commercial Managed Care - PPO | Source: Ambulatory Visit | Attending: Medical | Admitting: Medical

## 2019-07-07 ENCOUNTER — Other Ambulatory Visit: Payer: Self-pay

## 2019-07-07 DIAGNOSIS — I63511 Cerebral infarction due to unspecified occlusion or stenosis of right middle cerebral artery: Secondary | ICD-10-CM

## 2019-07-07 DIAGNOSIS — H748X1 Other specified disorders of right middle ear and mastoid: Secondary | ICD-10-CM

## 2019-07-07 MED ORDER — IOPAMIDOL (ISOVUE-370) INJECTION 76%
75.0000 mL | Freq: Once | INTRAVENOUS | Status: AC | PRN
  Administered 2019-07-07: 75 mL via INTRAVENOUS

## 2019-07-08 MED ORDER — GADOBENATE DIMEGLUMINE 529 MG/ML IV SOLN: 15.0000 mL | Freq: Once | INTRAVENOUS | Status: AC | PRN

## 2019-07-15 NOTE — Progress Notes (Signed)
[facility] [address] [place]. [place] [address] [phone]       STROKE FOLLOW UP NOTE  [name] Date of Birth:  28-Nov-1955 Medical Record Number:  [id number]   Reason for Referral: stroke follow up    CHIEF COMPLAINT:  Chief Complaint  Patient presents with  . Cerebrovascular Accident    Reports ED visit in December for nose bleed and hypertension. Losartan 25mg  daily was added for BP. He also tested positive for COVID-19 at that time. He has continued his medication regimen for stroke prevention.    HPI: Stroke admission 01/12/2019: Mr.Alex Schaelchlinis a 63 y.o.malewith no significant past medical history presented to [facility] ED on 01/12/2019 with left facial droop, garbled speech and abnormal gait.  Neurology consulted with stroke work-up completed and found to have right MCA insular/putamen cortical infarct as evidenced on MRI with CTA demonstrating a R MCA/ICA occlusion and sent to IR with TICI 3 revascularization of right M1 and right ICA with stent.  Post IR no hemorrhage or edema but did show a ring-enhancing lesion right middle cranial fossa which was also demonstrated on CT and CTA.  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  2D echo normal EF without cardiac source of embolus identified.  Lower extremity venous Dopplers negative for DVT.  TCD with bubble no HITS at rest or during Valsalva.  Unable to perform TEE due to inability to sedate and loop recorder placed on 01/14/2019 to assess for potential atrial fibrillation due to cryptogenic etiology.  Hypercoagulable work-up unremarkable.  Discharged on aspirin 81 mg daily and Brilinta 90 mg twice daily post stent.  Initiated high intensity statin for LDL 83.  Other stroke risk factors include EtOH  use but no prior history of stroke.  Discharged home in stable condition without therapy needs.  Initial visit 02/11/2019: He has been doing well from a neurological standpoint without residual deficits or reoccurring of symptoms.  He does endorse mild left upper and lower extremity muscle twitching at rest which was present prior to his stroke with possible slight worsening.  These are not debilitating or interfere with activity.  Denies pain.  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 Continues on atorvastatin 40 mg daily without myalgias Continues on aspirin 81 mg and Brilinta 90 mg twice daily without bleeding or bruising He did have follow-up with vascular surgery on 02/03/2019 and recommend repeat carotid Doppler in 3 months time Loop recorder has not shown atrial fibrillation thus far Blood pressure today satisfactory 130/83 He did have evaluation with specialist at [facility] regarding cholesterol granuloma.  Plans on repeating imaging in 3 months and will discuss future treatment options depending on imaging. No further concerns at this time  Update 07/16/2019: [name] is a 64 year old male who is being seen today for stroke follow-up. He has been doing well from a stroke standpoint without residual deficits or reoccurring symptoms. He continues on aspirin 81 mg daily and Brilinta 90 mg twice daily and did have prior episode of right-sided epistaxis on 05/31/2019 requiring cauterization with silver nitrate. He has not had any reoccurring epistaxis or bleeding or bruising. Continues on atorvastatin 40 mg daily without myalgias. Loop recorder isn't  shown atrial fibrillation thus far. Blood pressure today satisfactory at 120/77. He did have follow-up with Dr. Corliss Skains with repeat carotid ultrasound on 05/05/2019 which showed right  ICA patent stent with less than 50% stenosis and left ICA 1 to 39% stenosis and recommended repeat imaging in 6 months.  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  He does have multiple questions at today's visit regarding if cholesterol granuloma had an impact on his stroke and occlusion of right M1 which required stent placement.  No further concerns at this time.    ROS:   14 system review of systems performed and negative with exception of see HPI  PMH:  Past Medical History:  Diagnosis Date  . Hyperlipemia   . Stroke [facility])     PSH:  Past Surgical History:  Procedure Laterality Date  . IR ANGIO INTRA EXTRACRAN SEL COM CAROTID INNOMINATE UNI L MOD SED  01/13/2019  . IR ANGIO VERTEBRAL SEL SUBCLAVIAN INNOMINATE BILAT MOD SED  01/13/2019  . IR CT HEAD LTD  01/13/2019  . IR INTRAVSC STENT CERV CAROTID W/O EMB-PROT MOD SED INC ANGIO  01/13/2019  . IR PERCUTANEOUS ART THROMBECTOMY/INFUSION INTRACRANIAL INC DIAG ANGIO  01/13/2019  . LOOP RECORDER INSERTION N/A 01/14/2019   Procedure: LOOP RECORDER INSERTION;  Surgeon: Marinus Maw, MD;  Location: [facility] INVASIVE CV LAB;  Service: Cardiovascular;  Laterality: N/A;  . RADIOLOGY WITH ANESTHESIA N/A 01/13/2019   Procedure: RADIOLOGY WITH ANESTHESIA;  Surgeon: Julieanne Cotton, MD;  Location: [facility] OR;  Service: Radiology;  Laterality: N/A;    Social History:  Social History   Socioeconomic History  . Marital status: Married    Spouse name: Not on file  . Number of children: Not on file  . Years of education: Not on file  . Highest education level: Not on file  Occupational History  . Not on file  Tobacco Use  . Smoking status: Never Smoker  . Smokeless tobacco: Never Used  Substance and Sexual Activity  . Alcohol use: Yes    Comment: occassionally  . Drug use: Never  . Sexual activity: Not on file  Other Topics Concern  . Not on file  Social History Narrative  . Not on file    Social Determinants of Health   Financial Resource Strain:   . Difficulty of Paying Living Expenses: Not on file  Food Insecurity:   . Worried About Programme researcher, broadcasting/film/video in the Last Year: Not on file  . Ran Out of Food in the Last Year: Not on file  Transportation Needs:   . Lack of Transportation (Medical): Not on file  . Lack of Transportation (Non-Medical): Not on file  Physical Activity:   . Days of Exercise per Week: Not on file  . Minutes of Exercise per Session: Not on file  Stress:   . Feeling of Stress : Not on file  Social Connections:   . Frequency of Communication with Friends and Family: Not on file  . Frequency of Social Gatherings with Friends and Family: Not on file  . Attends Religious Services: Not on file  . Active Member of Clubs or Organizations: Not on file  . Attends Banker Meetings: Not on file  . Marital Status: Not on file  Intimate Partner Violence:   . Fear of Current or Ex-Partner: Not on file  . Emotionally Abused: Not on file  . Physically Abused: Not on file  . Sexually Abused: Not  on file    Family History:  Family History  Problem Relation Age of Onset  . Parkinson's disease Mother   . Heart disease Father     Medications:   Current Outpatient Medications on File Prior to Visit  Medication Sig Dispense Refill  . acetaminophen (TYLENOL) 500 MG tablet Take 1,000 mg by mouth every 6 (six) hours as needed for fever or headache (pain).    Marland Kitchen aspirin 81 MG chewable tablet Chew 1 tablet (81 mg total) by mouth daily. Or low dose 81 mg enteric coated aspirin    . atorvastatin (LIPITOR) 40 MG tablet Take 1 tablet (40 mg total) by mouth daily at 6 PM. (Patient taking differently: Take 40 mg by mouth at bedtime. ) 90 tablet 0  . losartan (COZAAR) 25 MG tablet Take 25 mg by mouth daily.    . ticagrelor (BRILINTA) 90 MG TABS tablet Take 1 tablet (90 mg total) by mouth 2 (two) times daily. 60 tablet 2  . Zinc 50 MG TABS Take 50 mg by  mouth 2 (two) times daily.      Current Facility-Administered Medications on File Prior to Visit  Medication Dose Route Frequency Provider Last Rate Last Admin  . gadobenate dimeglumine (MULTIHANCE) injection 15 mL  15 mL Intravenous Once PRN Atilano Median, PA        Allergies:   Allergies  Allergen Reactions  . Penicillins Rash    Did it involve swelling of the face/tongue/throat, SOB, or low BP? Unknown Did it involve sudden or severe rash/hives, skin peeling, or any reaction on the inside of your mouth or nose? Unknown Did you need to seek medical attention at a hospital or doctor's office? Unknown When did it last happen?young child If all above answers are "NO", may proceed with cephalosporin use.     Physical Exam  Vitals:   07/16/19 0727  BP: 120/77  Pulse: (!) 59  Temp: (!) 97.2 F (36.2 C)  Weight: 165 lb 9.6 oz (75.1 kg)  Height: 5\' 6"  (1.676 m)   Body mass index is 26.73 kg/m. No exam data present   General: well developed, well nourished,  pleasant middle-age Caucasian male, seated, in no evident distress Head: head normocephalic and atraumatic.   Neck: supple with no carotid or supraclavicular bruits Cardiovascular: regular rate and rhythm, no murmurs Musculoskeletal: no deformity Skin:  no rash/petichiae Vascular:  Normal pulses all extremities   Neurologic Exam Mental Status: Awake and fully alert. Oriented to place and time. Recent and remote memory intact. Attention span, concentration and fund of knowledge appropriate. Mood and affect appropriate.  Cranial Nerves: Pupils equal, briskly reactive to light. Extraocular movements full without nystagmus. Visual fields full to confrontation. Hearing intact. Facial sensation intact. Face, tongue, palate moves normally and symmetrically.  Motor: Normal bulk and tone. Normal strength in all tested extremity muscles.  Sensory.: intact to touch , pinprick , position and vibratory sensation.    Coordination: Rapid alternating movements normal in all extremities. Finger-to-nose and heel-to-shin performed accurately bilaterally. Gait and Station: Arises from chair without difficulty. Stance is normal. Gait demonstrates normal stride length and balance Reflexes: 1+ and symmetric. Toes downgoing.      Diagnostic Data (Labs, Imaging, Testing)  CT ANGIO HEAD W OR WO CONTRAST 07/07/2019 IMPRESSION: 1. Expected evolution of right MCA territory infarct. 2. No significant proximal stenosis, aneurysm, or branch vessel occlusion. 3. Minimal atherosclerotic changes within the cavernous segments of the internal carotid arteries bilaterally without significant stenosis. 4.  Stable expansile lesion at the right petrous apex compatible with a petrous apex granuloma.  MR BRAIN/IAC W WO CONTRAST 07/07/2019 IMPRESSION: Unchanged 3.3 cm expansile lesion centered within the right petrous apex with imaging features most consistent with cholesterol granuloma. The posterior aspect of the mass encroaches upon the right internal auditory canal. However, no intrinsic internal auditory canal lesion is identified. No evidence of acute intracranial abnormality. Redemonstrated chronic infarcts within the right frontal white matter, right basal ganglia and right insula.  VAS US CAROTID DUPLEX BILATERAL 05/05/2019 Summary: Right Carotid: Right internal carotid artery stent is patent with <50% stenosis. Left Carotid: Velocities in the left ICA are consistent with a 1-39% stenosis. Vertebrals:  Bilateral vertebral arteries demonstrate antegrade flow. Subclavians: Normal flow hemodynamics were seen in bilateral subclavian              arteries.    Ct Head Code Stroke Wo Contrast 01/13/2019 1. No intracranial hemorrhage. 2. ASPECTS is 10. 3. Right middle cranial fossa mass with extension through the skull base. Differential considerations include cranial nerve schwannoma, glomus jugulare paraganglioma  or aneurysm arising from the skull base segments of the right internal carotid artery.   Ct Code Stroke Cta Head W/wo Contrast Ct Code Stroke Cta Neck W/wo Contrast Ct Code Stroke Cta Cerebral Perfusion W/wo Contrast 01/13/2019 1. 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 2. 100 mL ischemic penumbra within the right MCA territory. ASPECTS 10. 3. 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   Ir Percutaneous Art Thrombectomy/infusion Intracranial Inc Diag Angio Ir Intravsc Stent Cerv Carotid W/o Emb-prot Mod Sed Ir Ct Head Ltd 01/13/2019 Status post endovascular complete revascularization of occluded right middle cerebral artery proximal M1 segment with 1 pass using the 5 mm x 33 mm Embotrap retrieval device with a Penumbra aspiration achieving a TICI 3 revascularization. Status post endovascular revascularization of symptomatic acute occlusion of the proximal right internal carotid artery with stent assisted angioplasty. PLAN: Follow-up in clinic 4 weeks post discharge.   [name] Wo Contrast 01/13/2019 1. Acute/subacute nonhemorrhagic cortical infarcts involving the anterior right insular cortex and lateral aspect of the right putamen. 2. No other significant right MCA territory infarct. 3. 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 There is no definite enhancement. There is some scalloping of the bone on the CT scan. There is no evidence for vascular lesion on the angiogram.   Vas [place] Transcranial Doppler W Bubbles 01/13/2019 No HITS at rest or during Valsalva. No apparent PFO.  Vas [place] Lower Extremity Venous (dvt) 01/13/2019 Right: There is no evidence of deep vein thrombosis in the lower extremity. No cystic  structure found in the popliteal fossa.  Left: There is no evidence of deep vein thrombosis in the lower extremity. No cystic structure found in the popliteal fossa.   2D Echocardiogram 1. The left ventricle has normal systolic function with an ejection fraction of 60-65%. The cavity size was normal. Left ventricular diastolic parameters were normal. 2. The right ventricle has normal systolic function. The cavity was normal. 3. The aortic root is normal in size and structure. 4. The mitral valve is grossly normal. 5. The tricuspid valve is grossly normal. 6. Normal LV function; no significant valvular heart disease.  TEE 01/14/2019  Unable to adequately sedate for procedure  Loop inserted 01/14/2019 w/o complication   ASSESSMENT: Chaseton Yepiz is a 64 y.o. year old male presented with left facial droop, garbled speech and abnormal gait on 01/12/2019 with completed stroke work-up revealing right MCA insular/putman cortical infarct s/p IR with revascularization right M1 and right ICA with stent secondary to embolic versus chiropractic trauma.  Loop recorder placed for long-term monitoring of potential atrial fibrillation.  Vascular risk factors include HLD and EtOH use.  Recovered well from a stroke standpoint without residual deficits.  Continues to follow with [facility] neurosurgery for possible future procedure regarding cholesterol granuloma    PLAN:  1. Right MCA infarct: Continue aspirin 81 mg daily and Brilinta (ticagrelor) 90 mg bid  and Lipitor for secondary stroke prevention. Maintain strict control of hypertension with blood pressure goal below 130/90, diabetes with hemoglobin A1c goal below 6.5% and cholesterol with LDL cholesterol (bad cholesterol) goal below 70 mg/dL.  I also advised the patient to eat a healthy diet with plenty of whole grains, cereals, fruits and vegetables, exercise regularly with at least 30 minutes of continuous activity daily and  maintain ideal body weight.  Continue to monitor loop recorder for atrial fibrillation. 2. Right MCA/ICA occlusion s/p revascularization and right ICA stent: Continue aspirin 81 mg and Brilinta and follow-up with IR for repeat imaging around 10/2019.  He questions ongoing use of aspirin and Brilinta and advised patient to contact Dr. Fatima Sanger office in regards to ongoing use of aspirin and Brilinta and future education 3. 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  Advised that this will be further discussed with Dr. Pearlean Brownie and he will be called back regarding his input.  Also advised to discuss questions with neurosurgery 4. HLD: Advised to continue current treatment regimen along with continued follow-up with PCP for future prescribing and monitoring of lipid panel    Follow up in 6 months or call earlier if needed   Greater than 50% of time during this 30 minute visit was spent on counseling, explanation of diagnosis of right MCA infarct, discussion regarding right MCA/ICA occlusion, discussion regarding loop recorder placement to assess for potential atrial fibrillation, reviewing risk factor management of HLD, planning of further management along with potential future management, discussion regarding cholesterol granuloma, and discussion with patient answering questions to satisfaction    Ihor Austin, AGNP-BC  [facility] [address] [place], [place] [address]  Phone [phone] Fax [phone] Note: This document was prepared with digital dictation and possible smart phrase technology. Any transcriptional errors that result from this process are unintentional.

## 2019-07-16 ENCOUNTER — Encounter: Payer: Self-pay | Admitting: Adult Health

## 2019-07-16 ENCOUNTER — Ambulatory Visit: Payer: Commercial Managed Care - PPO | Admitting: Adult Health

## 2019-07-16 ENCOUNTER — Other Ambulatory Visit: Payer: Self-pay

## 2019-07-16 VITALS — BP 120/77 | HR 59 | Temp 97.2°F | Ht 66.0 in | Wt 165.6 lb

## 2019-07-16 DIAGNOSIS — E785 Hyperlipidemia, unspecified: Secondary | ICD-10-CM | POA: Diagnosis not present

## 2019-07-16 DIAGNOSIS — H748X1 Other specified disorders of right middle ear and mastoid: Secondary | ICD-10-CM | POA: Diagnosis not present

## 2019-07-16 DIAGNOSIS — I639 Cerebral infarction, unspecified: Secondary | ICD-10-CM

## 2019-07-16 NOTE — Progress Notes (Signed)
I agree with the above plan 

## 2019-07-16 NOTE — Patient Instructions (Signed)
Continue aspirin 81 mg daily and Brilinta (ticagrelor) 90 mg bid  and Lipitor for secondary stroke prevention  Return to Dr. Fatima Sanger office in regards to continuation of aspirin and Brilinta and follow-up with repeat imaging 6 months from prior ultrasound  Continue to follow up with PCP regarding cholesterol and blood pressure management   Continue to monitor blood pressure at home  Maintain strict control of hypertension with blood pressure goal below 130/90, diabetes with hemoglobin A1c goal below 6.5% and cholesterol with LDL cholesterol (bad cholesterol) goal below 70 mg/dL. I also advised the patient to eat a healthy diet with plenty of whole grains, cereals, fruits and vegetables, exercise regularly and maintain ideal body weight.  Followup in the future with me in 6 months or call earlier if needed       Thank you for coming to see [place] at [facility]. I hope we have been able to provide you high quality care today.  You may receive a patient satisfaction survey over the next few weeks. We would appreciate your feedback and comments so that we may continue to improve ourselves and the health of our patients.

## 2019-07-31 ENCOUNTER — Ambulatory Visit (INDEPENDENT_AMBULATORY_CARE_PROVIDER_SITE_OTHER): Payer: Commercial Managed Care - PPO | Admitting: *Deleted

## 2019-07-31 DIAGNOSIS — I639 Cerebral infarction, unspecified: Secondary | ICD-10-CM | POA: Diagnosis not present

## 2019-07-31 LAB — CUP PACEART REMOTE DEVICE CHECK
Date Time Interrogation Session: 20210204002728
Implantable Pulse Generator Implant Date: 20200720200000

## 2019-08-01 NOTE — Progress Notes (Signed)
ILR Remote 

## 2019-08-20 ENCOUNTER — Telehealth: Payer: Self-pay | Admitting: Student

## 2019-08-20 NOTE — Telephone Encounter (Signed)
NIR.  Received call from patient requesting refill of Brilinta. Called [facility] [phone] at 1230 to fill prescription- Brilinta 90 mg tablets, take one tablet by mouth twice daily, dispense 60 tablets with 3 refills.   Waylan Boga Daylynn Stumpp, PA-C 08/20/2019, 12:31 PM

## 2019-09-01 ENCOUNTER — Ambulatory Visit (INDEPENDENT_AMBULATORY_CARE_PROVIDER_SITE_OTHER): Payer: Commercial Managed Care - PPO | Admitting: *Deleted

## 2019-09-01 DIAGNOSIS — I639 Cerebral infarction, unspecified: Secondary | ICD-10-CM | POA: Diagnosis not present

## 2019-09-01 LAB — CUP PACEART REMOTE DEVICE CHECK
Date Time Interrogation Session: 20210307002748
Implantable Pulse Generator Implant Date: 20200721

## 2019-09-02 NOTE — Progress Notes (Signed)
ILR Remote 

## 2019-10-02 ENCOUNTER — Ambulatory Visit (INDEPENDENT_AMBULATORY_CARE_PROVIDER_SITE_OTHER): Payer: Commercial Managed Care - PPO | Admitting: *Deleted

## 2019-10-02 ENCOUNTER — Encounter: Payer: Self-pay | Admitting: Adult Health

## 2019-10-02 DIAGNOSIS — I639 Cerebral infarction, unspecified: Secondary | ICD-10-CM

## 2019-10-02 LAB — CUP PACEART REMOTE DEVICE CHECK
Date Time Interrogation Session: 20210407013751
Implantable Pulse Generator Implant Date: 20200721

## 2019-10-02 NOTE — Progress Notes (Signed)
ILR Remote 

## 2019-10-04 ENCOUNTER — Ambulatory Visit: Payer: Commercial Managed Care - PPO | Attending: Internal Medicine

## 2019-10-04 DIAGNOSIS — Z23 Encounter for immunization: Secondary | ICD-10-CM

## 2019-10-04 NOTE — Progress Notes (Signed)
   Covid-19 Vaccination Clinic  Name:  Alex Heath    MRN: [id number] DOB: 01-Jul-1955  10/04/2019  Mr. [name] was observed post Covid-19 immunization for 15 minutes without incident. He was provided with Vaccine Information Sheet and instruction to access the V-Safe system.   Mr. [name] was instructed to call 911 with any severe reactions post vaccine: Marland Kitchen Difficulty breathing  . Swelling of face and throat  . A fast heartbeat  . A bad rash all over body  . Dizziness and weakness   Immunizations Administered    Name Date Dose VIS Date Route   Pfizer COVID-19 Vaccine 10/04/2019  1:24 PM 0.3 mL 06/06/2019 Intramuscular   Manufacturer: [employer], [employer]   Lot: [id number]   NDC: [address]-4

## 2019-10-27 ENCOUNTER — Ambulatory Visit: Payer: Commercial Managed Care - PPO | Attending: Internal Medicine

## 2019-10-27 DIAGNOSIS — Z23 Encounter for immunization: Secondary | ICD-10-CM

## 2019-10-27 NOTE — Progress Notes (Signed)
   Covid-19 Vaccination Clinic  Name:  Plummer Matich    MRN: [id number] DOB: 08/04/1955  10/27/2019  Mr. Heckmann was observed post Covid-19 immunization for 15 minutes without incident. He was provided with Vaccine Information Sheet and instruction to access the V-Safe system.   Mr. Stailey was instructed to call 911 with any severe reactions post vaccine: Marland Kitchen Difficulty breathing  . Swelling of face and throat  . A fast heartbeat  . A bad rash all over body  . Dizziness and weakness   Immunizations Administered    Name Date Dose VIS Date Route   Pfizer COVID-19 Vaccine 10/27/2019 10:00 AM 0.3 mL 08/20/2018 Intramuscular   Manufacturer: [employer], [employer]   Lot: [id number]   NDC: [address]-6

## 2019-10-28 ENCOUNTER — Ambulatory Visit: Payer: Commercial Managed Care - PPO

## 2019-11-01 LAB — CUP PACEART REMOTE DEVICE CHECK
Date Time Interrogation Session: 20210508014123
Implantable Pulse Generator Implant Date: 20200721

## 2019-11-04 ENCOUNTER — Ambulatory Visit (INDEPENDENT_AMBULATORY_CARE_PROVIDER_SITE_OTHER): Payer: Commercial Managed Care - PPO | Admitting: *Deleted

## 2019-11-04 DIAGNOSIS — I6601 Occlusion and stenosis of right middle cerebral artery: Secondary | ICD-10-CM

## 2019-11-05 NOTE — Progress Notes (Signed)
Carelink Summary Report / Loop Recorder 

## 2019-11-25 ENCOUNTER — Other Ambulatory Visit (HOSPITAL_COMMUNITY): Payer: Self-pay | Admitting: Interventional Radiology

## 2019-11-25 DIAGNOSIS — I771 Stricture of artery: Secondary | ICD-10-CM

## 2019-11-25 DIAGNOSIS — I639 Cerebral infarction, unspecified: Secondary | ICD-10-CM

## 2019-12-04 ENCOUNTER — Other Ambulatory Visit: Payer: Self-pay

## 2019-12-04 ENCOUNTER — Ambulatory Visit (HOSPITAL_COMMUNITY)
Admission: RE | Admit: 2019-12-04 | Discharge: 2019-12-04 | Disposition: A | Discharge status: Home / Self Care | Payer: Commercial Managed Care - PPO | Source: Ambulatory Visit | Attending: Interventional Radiology | Admitting: Interventional Radiology

## 2019-12-04 DIAGNOSIS — I639 Cerebral infarction, unspecified: Secondary | ICD-10-CM | POA: Diagnosis not present

## 2019-12-04 DIAGNOSIS — I771 Stricture of artery: Secondary | ICD-10-CM | POA: Diagnosis not present

## 2019-12-04 NOTE — Progress Notes (Signed)
Carotid artery duplex has been completed. Preliminary results can be found in CV Proc through chart review.   12/04/19 1:29 PM Olen Cordial RVT

## 2019-12-08 ENCOUNTER — Telehealth (HOSPITAL_COMMUNITY): Payer: Self-pay

## 2019-12-08 ENCOUNTER — Ambulatory Visit (INDEPENDENT_AMBULATORY_CARE_PROVIDER_SITE_OTHER): Payer: Commercial Managed Care - PPO | Admitting: *Deleted

## 2019-12-08 DIAGNOSIS — I639 Cerebral infarction, unspecified: Secondary | ICD-10-CM

## 2019-12-08 NOTE — Telephone Encounter (Signed)
Pt agreed to f/u in 6 months with us carotid. AW 

## 2019-12-09 LAB — CUP PACEART REMOTE DEVICE CHECK
Date Time Interrogation Session: 20210614001052
Implantable Pulse Generator Implant Date: 20200721

## 2019-12-09 NOTE — Progress Notes (Signed)
Carelink Summary Report / Loop Recorder 

## 2020-01-12 ENCOUNTER — Ambulatory Visit (INDEPENDENT_AMBULATORY_CARE_PROVIDER_SITE_OTHER): Payer: Commercial Managed Care - PPO | Admitting: *Deleted

## 2020-01-12 DIAGNOSIS — I639 Cerebral infarction, unspecified: Secondary | ICD-10-CM

## 2020-01-12 LAB — CUP PACEART REMOTE DEVICE CHECK
Date Time Interrogation Session: 20210718231205
Implantable Pulse Generator Implant Date: 20200721

## 2020-01-13 ENCOUNTER — Ambulatory Visit: Payer: Commercial Managed Care - PPO | Admitting: Adult Health

## 2020-01-13 ENCOUNTER — Encounter: Payer: Self-pay | Admitting: Adult Health

## 2020-01-13 VITALS — BP 116/76 | HR 63 | Ht 66.0 in | Wt 165.0 lb

## 2020-01-13 DIAGNOSIS — E785 Hyperlipidemia, unspecified: Secondary | ICD-10-CM | POA: Diagnosis not present

## 2020-01-13 DIAGNOSIS — H748X1 Other specified disorders of right middle ear and mastoid: Secondary | ICD-10-CM | POA: Diagnosis not present

## 2020-01-13 DIAGNOSIS — I639 Cerebral infarction, unspecified: Secondary | ICD-10-CM | POA: Diagnosis not present

## 2020-01-13 NOTE — Progress Notes (Signed)
[facility] [address] [place]. [place] [address] [phone]       STROKE FOLLOW UP NOTE  [name] Date of Birth:  06-26-1956 Medical Record Number:  [id number]   Reason for Referral: stroke follow up    CHIEF COMPLAINT:  Chief Complaint  Patient presents with  . Follow-up    6 month f/u, states he has been doing well since last visit  . room 9    alone    HPI:  Today, 01/13/2020, [name] returns for stroke follow-up.  He has been stable from a stroke standpoint without residual deficits or new stroke/TIA symptoms.  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  Recently seen by audiology for continued right ear fullness, decreased hearing and tinnitus and plans on pursuing hearing aids.  Continues on aspirin 81 mg daily without bleeding or bruising.  Recent carotid ultrasound stable appearance from prior ultrasound and recommended repeat carotid ultrasound in 6 months.  Continues on atorvastatin 40 mg daily without myalgias.  Loop recorder has not shown atrial fibrillation thus far.  Blood pressure today 116/76.  No concerns at this time.    History provided for reference purposes only Update 07/16/2019 JM: [name] is a 64 year old male who is being seen today for stroke follow-up. He has been doing well from a stroke standpoint without residual deficits or reoccurring symptoms. He continues on aspirin 81 mg daily and Brilinta 90 mg twice daily and did have prior episode of right-sided epistaxis on 05/31/2019 requiring cauterization with silver nitrate. He has not had any reoccurring epistaxis or bleeding or bruising. Continues on atorvastatin 40 mg daily without myalgias. Loop recorder isn't shown atrial fibrillation thus far. Blood pressure today satisfactory at 120/77. He did have follow-up with Dr. Corliss Skains with repeat carotid ultrasound on 05/05/2019 which showed right ICA  patent stent with less than 50% stenosis and left ICA 1 to 39% stenosis and recommended repeat imaging in 6 months.  He continues to follow with neurosurgery at [facility] regarding cholesterol granuloma.  He does have multiple questions at today's visit regarding if cholesterol granuloma had an impact on his stroke and occlusion of right M1 which required stent placement.  No further concerns at this time.  Initial visit 02/11/2019 JM: He has been doing well from a neurological standpoint without residual deficits or reoccurring of symptoms.  He does endorse mild left upper and lower extremity muscle twitching at rest which was present prior to his stroke with possible slight worsening.  These are not debilitating or interfere with activity.  Denies pain.  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 Continues on atorvastatin 40 mg daily without myalgias Continues on aspirin 81 mg and Brilinta 90 mg twice daily without bleeding or bruising He did have follow-up with vascular surgery on 02/03/2019 and recommend repeat carotid Doppler in 3 months time Loop recorder has not shown atrial fibrillation thus far Blood pressure today satisfactory 130/83 He did have evaluation with specialist at [facility] regarding cholesterol granuloma.  Plans on repeating imaging in 3 months and will discuss future treatment options depending on imaging. No further concerns at this time  Stroke admission 01/12/2019: Mr.Alex Schaelchlinis a 64 y.o.malewith no significant past medical history presented to [facility] ED on 01/12/2019 with left facial droop, garbled speech  and abnormal gait.  Neurology consulted with stroke work-up completed and found to have right MCA insular/putamen cortical infarct as evidenced on MRI with CTA demonstrating a R MCA/ICA  occlusion and sent to IR with TICI 3 revascularization of right M1 and right ICA with stent.  Post IR no hemorrhage or edema but did show a ring-enhancing lesion right middle cranial fossa which was also demonstrated on CT and CTA.  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  2D echo normal EF without cardiac source of embolus identified.  Lower extremity venous Dopplers negative for DVT.  TCD with bubble no HITS at rest or during Valsalva.  Unable to perform TEE due to inability to sedate and loop recorder placed on 01/14/2019 to assess for potential atrial fibrillation due to cryptogenic etiology.  Hypercoagulable work-up unremarkable.  Discharged on aspirin 81 mg daily and Brilinta 90 mg twice daily post stent.  Initiated high intensity statin for LDL 83.  Other stroke risk factors include EtOH use but no prior history of stroke.  Discharged home in stable condition without therapy needs.  Stroke: R MCA insular/putament corticalinfarct s/p IR with revascularization R M1 and R ICA w/ stent - embolic vs chiropractic trauma   Code Stroke CT head No hemorrhage. R middle cranial fossa mass with extension through the skull base. ASPECTS 10.   CTA head &neck R MCA M2 ELVO. R ICA cervical ELVO. 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 Ring enhancing lesion R middle cranial fossa  MRIAcute/subacute cortical anterior R insular and lateral putamen infarct. 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  2D  EchoEF 60-65%. No source of embolus   Lower extremity Dopplers no DVT  TCD with bubbleno HITS at rest or during valsalva  TEE unable to perform d/t inability to sedate  Loop placd 01/14/2019  LDL83  HgbA1c6.0  HIVnegative  Hypercoagulable labs pending   No antithromboticprior to admission, now on aspirin 81 mg daily and Brilinta (ticagrelor) 90 mg bid. Continue at discharge  Therapy recommendations:no therapy needs  Disposition:d/c home     ROS:   14 system review of systems performed and negative with exception of see HPI  PMH:  Past Medical History:  Diagnosis Date  . Hyperlipemia   . Stroke [facility])     PSH:  Past Surgical History:  Procedure Laterality Date  . IR ANGIO INTRA EXTRACRAN SEL COM CAROTID INNOMINATE UNI L MOD SED  01/13/2019  . IR ANGIO VERTEBRAL SEL SUBCLAVIAN INNOMINATE BILAT MOD SED  01/13/2019  . IR CT HEAD LTD  01/13/2019  . IR INTRAVSC STENT CERV CAROTID W/O EMB-PROT MOD SED INC ANGIO  01/13/2019  . IR PERCUTANEOUS ART THROMBECTOMY/INFUSION INTRACRANIAL INC DIAG ANGIO  01/13/2019  . LOOP RECORDER INSERTION N/A 01/14/2019   Procedure: LOOP RECORDER INSERTION;  Surgeon: Marinus Maw, MD;  Location: [facility] INVASIVE CV LAB;  Service: Cardiovascular;  Laterality: N/A;  . RADIOLOGY WITH ANESTHESIA N/A 01/13/2019   Procedure: RADIOLOGY WITH ANESTHESIA;  Surgeon: Julieanne Cotton, MD;  Location: [facility] OR;  Service: Radiology;  Laterality: N/A;    Social History:  Social History   Socioeconomic History  . Marital status: Married    Spouse name: Not on file  . Number of children: Not on file  . Years of education: Not on file  . Highest education level: Not on file  Occupational History  . Not on file  Tobacco Use  . Smoking status: Never Smoker  . Smokeless tobacco: Never Used  Vaping Use  . Vaping Use: Never used  Substance and Sexual Activity  . Alcohol use: Yes    Comment: occassionally  . Drug use: Never  . Sexual activity: Not on  file  Other Topics Concern  . Not on file  Social History Narrative  . Not on file   Social Determinants of Health   Financial Resource Strain:   . Difficulty of Paying Living Expenses:   Food Insecurity:   . Worried About Programme researcher, broadcasting/film/videounning Out of Food in the Last Year:   . Baristaan Out of Food in the Last Year:   Transportation Needs:   . Freight forwarderLack of Transportation (Medical):   Marland Kitchen. Lack of Transportation (Non-Medical):   Physical Activity:   . Days of Exercise per Week:   . Minutes of Exercise per Session:   Stress:   . Feeling of Stress :   Social Connections:   . Frequency of Communication with Friends and Family:   . Frequency of Social Gatherings with Friends and Family:   . Attends Religious Services:   . Active Member of Clubs or Organizations:   . Attends BankerClub or Organization Meetings:   Marland Kitchen. Marital Status:   Intimate Partner Violence:   . Fear of Current or Ex-Partner:   . Emotionally Abused:   Marland Kitchen. Physically Abused:   . Sexually Abused:     Family History:  Family History  Problem Relation Age of Onset  . Parkinson's disease Mother   . Heart disease Father     Medications:   Current Outpatient Medications on File Prior to Visit  Medication Sig Dispense Refill  . acetaminophen (TYLENOL) 500 MG tablet Take 1,000 mg by mouth every 6 (six) hours as needed for fever or headache (pain).    Marland Kitchen. aspirin 81 MG chewable tablet Chew 1 tablet (81 mg total) by mouth daily. Or low dose 81 mg enteric coated aspirin    . atorvastatin (LIPITOR) 40 MG tablet Take 1 tablet (40 mg total) by mouth daily at 6 PM. (Patient taking differently: Take 40 mg by mouth at bedtime. ) 90 tablet 0  . losartan (COZAAR) 25 MG tablet Take 25 mg by mouth daily.    . Zinc 50 MG TABS Take 50 mg by mouth 2 (two) times daily.      Current Facility-Administered Medications on File Prior to Visit  Medication Dose Route Frequency Provider Last Rate Last Admin  . gadobenate dimeglumine (MULTIHANCE) injection 15 mL  15 mL  Intravenous Once PRN Atilano MedianGuerriero, Emily R, PA        Allergies:   Allergies  Allergen Reactions  . Penicillins Rash    Did it involve swelling of the face/tongue/throat, SOB, or low BP? Unknown Did it involve sudden or severe rash/hives, skin peeling, or any reaction on the inside of your mouth or nose? Unknown Did you need to seek medical attention at a hospital or doctor's office? Unknown When did it last happen?young child If all above answers are "NO", may proceed with cephalosporin use.     Physical Exam  Vitals:  01/13/20 0827  BP: 116/76  Pulse: 63  Weight: 165 lb (74.8 kg)  Height: 5\' 6"  (1.676 m)   Body mass index is 26.63 kg/m. No exam data present   General: well developed, well nourished,  pleasant middle-age Caucasian male, seated, in no evident distress Head: head normocephalic and atraumatic.   Neck: supple with no carotid or supraclavicular bruits Cardiovascular: regular rate and rhythm, no murmurs Musculoskeletal: no deformity Skin:  no rash/petichiae Vascular:  Normal pulses all extremities   Neurologic Exam Mental Status: Awake and fully alert.   Fluent speech and language.  Oriented to place and time. Recent and remote memory intact. Attention span, concentration and fund of knowledge appropriate. Mood and affect appropriate.  Cranial Nerves: Pupils equal, briskly reactive to light. Extraocular movements full without nystagmus. Visual fields full to confrontation.  Hearing deficit bilaterally.  Facial sensation intact. Face, tongue, palate moves normally and symmetrically.  Motor: Normal bulk and tone. Normal strength in all tested extremity muscles.  Sensory.: intact to touch , pinprick , position and vibratory sensation.  Coordination: Rapid alternating movements normal in all extremities. Finger-to-nose and heel-to-shin performed accurately bilaterally. Gait and Station: Arises from chair without difficulty. Stance is normal. Gait demonstrates  normal stride length and balance Reflexes: 1+ and symmetric. Toes downgoing.        ASSESSMENT/PLAN: Lincoln Ginley is a 64 y.o. year old male presented with left facial droop, garbled speech and abnormal gait on 01/12/2019 with completed stroke work-up revealing right MCA insular/putman cortical infarct s/p IR with revascularization right M1 and right ICA with stent secondary to embolic versus chiropractic trauma.  Loop recorder placed for long-term monitoring of potential atrial fibrillation.  Vascular risk factors include HLD and EtOH use.  Incidental finding of cholesterol granuloma.      1. Right MCA infarct:  -No residual deficits -Continue aspirin 81 mg daily  and Lipitor for secondary stroke prevention. -Continue to monitor loop recorder which has not shown atrial fibrillation thus far -Close follow-up with PCP for aggressive stroke risk factor management. 2. Right MCA/ICA occlusion s/p revascularization and right ICA stent: Ongoing monitoring and management via IR Dr. 01/14/2019.  Recent carotid ultrasound 10/2019 stable and recommended repeat ultrasound in 6 months.   3. 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  Continue to follow with otolaryngology by [facility] without complication.  Continue to follow for ongoing monitoring 4. HLD: LDL goal<70.  Recent lipid panel by PCP.  Unable to view via epic but per patient, satisfactory.  Continue atorvastatin and ongoing follow-up with PCP for prescribing, monitoring and management    Overall stable from stroke standpoint and recommend follow-up on an as-needed basis   I spent 30 minutes of face-to-face and non-face-to-face time with patient.  This included previsit chart review, lab review, study review, order entry, electronic health record documentation, patient education regarding history of stroke, importance of managing stroke risk factors, importance of ongoing monitoring of loop recorder, reviewing  discussion regarding cholesterol granuloma with resection and answered all other questions to patient satisfaction    [facility], [facility]  [facility] [address] [place], [place]  Phone [phone] Fax [phone] Note: This document was prepared with digital dictation and possible smart phrase technology. Any transcriptional errors that result from this process are unintentional.

## 2020-01-13 NOTE — Progress Notes (Signed)
I agree with the above plan 

## 2020-01-13 NOTE — Patient Instructions (Signed)
Continue aspirin 81 mg daily  and atorvastatin 40 mg daily for secondary stroke prevention  Continue to follow up with PCP regarding cholesterol and blood pressure management  Maintain strict control of hypertension with blood pressure goal below 130/90 and cholesterol with LDL cholesterol (bad cholesterol) goal below 70 mg/dL.  Continue to follow with audiology and otolaryngology as scheduled.  Per audiology note, symptoms likely related to mass-effect on your cochlear nerve from the prior cyst (cholesterol granuloma)  Loop recorder will continue to be monitored for possible atrial fibrillation  Follow-up with Dr. Corliss Skains around December for repeat carotid ultrasound    Overall stable from stroke standpoint recommend follow-up on an as-needed basis       Thank you for coming to see [place] at [facility]. I hope we have been able to provide you high quality care today.  You may receive a patient satisfaction survey over the next few weeks. We would appreciate your feedback and comments so that we may continue to improve ourselves and the health of our patients.

## 2020-01-14 NOTE — Progress Notes (Signed)
Carelink Summary Report / Loop Recorder 

## 2020-02-16 ENCOUNTER — Ambulatory Visit (INDEPENDENT_AMBULATORY_CARE_PROVIDER_SITE_OTHER): Payer: Commercial Managed Care - PPO | Admitting: *Deleted

## 2020-02-16 DIAGNOSIS — I639 Cerebral infarction, unspecified: Secondary | ICD-10-CM

## 2020-02-16 LAB — CUP PACEART REMOTE DEVICE CHECK
Date Time Interrogation Session: 20210820231215
Implantable Pulse Generator Implant Date: 20200721

## 2020-02-23 NOTE — Progress Notes (Signed)
Carelink Summary Report / Loop Recorder 

## 2020-03-21 LAB — CUP PACEART REMOTE DEVICE CHECK
Date Time Interrogation Session: 20210922233435
Implantable Pulse Generator Implant Date: 20200721

## 2020-03-22 ENCOUNTER — Ambulatory Visit (INDEPENDENT_AMBULATORY_CARE_PROVIDER_SITE_OTHER): Payer: Commercial Managed Care - PPO | Admitting: Emergency Medicine

## 2020-03-22 DIAGNOSIS — I639 Cerebral infarction, unspecified: Secondary | ICD-10-CM | POA: Diagnosis not present

## 2020-03-25 NOTE — Progress Notes (Signed)
Carelink Summary Report / Loop Recorder 

## 2020-04-24 LAB — CUP PACEART REMOTE DEVICE CHECK
Date Time Interrogation Session: 20211026000217
Implantable Pulse Generator Implant Date: 20200721

## 2020-04-26 ENCOUNTER — Ambulatory Visit (INDEPENDENT_AMBULATORY_CARE_PROVIDER_SITE_OTHER): Payer: Commercial Managed Care - PPO

## 2020-04-26 DIAGNOSIS — I639 Cerebral infarction, unspecified: Secondary | ICD-10-CM | POA: Diagnosis not present

## 2020-04-29 NOTE — Progress Notes (Signed)
Carelink Summary Report / Loop Recorder 

## 2020-05-31 ENCOUNTER — Ambulatory Visit (INDEPENDENT_AMBULATORY_CARE_PROVIDER_SITE_OTHER): Payer: Commercial Managed Care - PPO

## 2020-05-31 DIAGNOSIS — I639 Cerebral infarction, unspecified: Secondary | ICD-10-CM | POA: Diagnosis not present

## 2020-05-31 LAB — CUP PACEART REMOTE DEVICE CHECK
Date Time Interrogation Session: 20211127231249
Implantable Pulse Generator Implant Date: 20200721

## 2020-06-10 NOTE — Progress Notes (Signed)
Carelink Summary Report / Loop Recorder 

## 2020-06-23 ENCOUNTER — Other Ambulatory Visit (HOSPITAL_COMMUNITY): Payer: Self-pay | Admitting: Interventional Radiology

## 2020-06-23 DIAGNOSIS — I639 Cerebral infarction, unspecified: Secondary | ICD-10-CM

## 2020-06-23 DIAGNOSIS — I771 Stricture of artery: Secondary | ICD-10-CM

## 2020-07-02 ENCOUNTER — Ambulatory Visit (HOSPITAL_COMMUNITY)
Admission: RE | Admit: 2020-07-02 | Discharge: 2020-07-02 | Disposition: A | Discharge status: Home / Self Care | Payer: Commercial Managed Care - PPO | Source: Ambulatory Visit | Attending: Interventional Radiology | Admitting: Interventional Radiology

## 2020-07-02 ENCOUNTER — Other Ambulatory Visit: Payer: Self-pay

## 2020-07-02 DIAGNOSIS — I771 Stricture of artery: Secondary | ICD-10-CM | POA: Diagnosis not present

## 2020-07-02 DIAGNOSIS — I639 Cerebral infarction, unspecified: Secondary | ICD-10-CM | POA: Diagnosis not present

## 2020-07-02 NOTE — Progress Notes (Signed)
Carotid artery duplex completed. Refer to "CV Proc" under chart review to view preliminary results.  07/02/2020 4:25 PM Eula Fried., MHA, RVT, RDCS, RDMS

## 2020-07-05 ENCOUNTER — Telehealth (HOSPITAL_COMMUNITY): Payer: Self-pay

## 2020-07-05 NOTE — Telephone Encounter (Signed)
Pt agreed to f/u in 6 months with us carotid. AW 

## 2020-07-12 ENCOUNTER — Ambulatory Visit (INDEPENDENT_AMBULATORY_CARE_PROVIDER_SITE_OTHER): Payer: Commercial Managed Care - PPO

## 2020-07-12 DIAGNOSIS — I639 Cerebral infarction, unspecified: Secondary | ICD-10-CM

## 2020-07-14 LAB — CUP PACEART REMOTE DEVICE CHECK
Date Time Interrogation Session: 20220108231236
Implantable Pulse Generator Implant Date: 20200721

## 2020-07-27 DIAGNOSIS — I639 Cerebral infarction, unspecified: Secondary | ICD-10-CM | POA: Diagnosis not present

## 2020-07-27 NOTE — Progress Notes (Signed)
Carelink Summary Report / Loop Recorder 

## 2020-08-16 ENCOUNTER — Ambulatory Visit (INDEPENDENT_AMBULATORY_CARE_PROVIDER_SITE_OTHER): Payer: Commercial Managed Care - PPO

## 2020-08-16 DIAGNOSIS — I639 Cerebral infarction, unspecified: Secondary | ICD-10-CM

## 2020-08-17 LAB — CUP PACEART REMOTE DEVICE CHECK
Date Time Interrogation Session: 20220219231539
Implantable Pulse Generator Implant Date: 20200721

## 2020-08-20 NOTE — Progress Notes (Signed)
Carelink Summary Report / Loop Recorder 

## 2020-08-23 NOTE — Addendum Note (Signed)
Addended by: Geralyn Flash D on: 08/23/2020 08:37 AM   Modules accepted: Level of Service

## 2020-09-19 LAB — CUP PACEART REMOTE DEVICE CHECK
Date Time Interrogation Session: 20220325003107
Implantable Pulse Generator Implant Date: 20200721

## 2020-09-20 ENCOUNTER — Ambulatory Visit (INDEPENDENT_AMBULATORY_CARE_PROVIDER_SITE_OTHER): Payer: Commercial Managed Care - PPO

## 2020-09-20 DIAGNOSIS — I639 Cerebral infarction, unspecified: Secondary | ICD-10-CM

## 2020-10-04 NOTE — Progress Notes (Signed)
Carelink Summary Report / Loop Recorder 

## 2020-10-25 ENCOUNTER — Ambulatory Visit (INDEPENDENT_AMBULATORY_CARE_PROVIDER_SITE_OTHER): Payer: Commercial Managed Care - PPO

## 2020-10-25 DIAGNOSIS — I639 Cerebral infarction, unspecified: Secondary | ICD-10-CM

## 2020-10-25 LAB — CUP PACEART REMOTE DEVICE CHECK
Date Time Interrogation Session: 20220427013022
Implantable Pulse Generator Implant Date: 20200721

## 2020-11-16 NOTE — Progress Notes (Signed)
Carelink Summary Report / Loop Recorder 

## 2020-11-22 LAB — CUP PACEART REMOTE DEVICE CHECK
Date Time Interrogation Session: 20220530014556
Implantable Pulse Generator Implant Date: 20200721

## 2020-11-23 ENCOUNTER — Ambulatory Visit (INDEPENDENT_AMBULATORY_CARE_PROVIDER_SITE_OTHER): Payer: Commercial Managed Care - PPO

## 2020-11-23 DIAGNOSIS — I639 Cerebral infarction, unspecified: Secondary | ICD-10-CM

## 2020-12-16 NOTE — Progress Notes (Signed)
Carelink Summary Report / Loop Recorder 

## 2020-12-20 NOTE — Addendum Note (Signed)
Addended by: Geralyn Flash D on: 12/20/2020 04:18 PM   Modules accepted: Level of Service

## 2020-12-28 ENCOUNTER — Ambulatory Visit (INDEPENDENT_AMBULATORY_CARE_PROVIDER_SITE_OTHER): Payer: Commercial Managed Care - PPO

## 2020-12-28 DIAGNOSIS — I639 Cerebral infarction, unspecified: Secondary | ICD-10-CM | POA: Diagnosis not present

## 2020-12-28 LAB — CUP PACEART REMOTE DEVICE CHECK
Date Time Interrogation Session: 20220702014546
Implantable Pulse Generator Implant Date: 20200721

## 2021-01-17 NOTE — Progress Notes (Signed)
Carelink Summary Report / Loop Recorder 

## 2021-01-27 LAB — CUP PACEART REMOTE DEVICE CHECK
Date Time Interrogation Session: 20220804014955
Implantable Pulse Generator Implant Date: 20200721

## 2021-01-31 ENCOUNTER — Ambulatory Visit (INDEPENDENT_AMBULATORY_CARE_PROVIDER_SITE_OTHER): Payer: Commercial Managed Care - PPO

## 2021-01-31 DIAGNOSIS — I639 Cerebral infarction, unspecified: Secondary | ICD-10-CM | POA: Diagnosis not present

## 2021-02-08 ENCOUNTER — Other Ambulatory Visit (HOSPITAL_COMMUNITY): Payer: Self-pay | Admitting: Interventional Radiology

## 2021-02-08 ENCOUNTER — Telehealth (HOSPITAL_COMMUNITY): Payer: Self-pay

## 2021-02-08 DIAGNOSIS — I771 Stricture of artery: Secondary | ICD-10-CM

## 2021-02-08 NOTE — Telephone Encounter (Signed)
Called to schedule f/u US, no answer, left vm. AW

## 2021-02-16 ENCOUNTER — Other Ambulatory Visit: Payer: Self-pay

## 2021-02-16 ENCOUNTER — Ambulatory Visit (HOSPITAL_COMMUNITY)
Admission: RE | Admit: 2021-02-16 | Discharge: 2021-02-16 | Disposition: A | Discharge status: Home / Self Care | Payer: Commercial Managed Care - PPO | Source: Ambulatory Visit | Attending: Interventional Radiology | Admitting: Interventional Radiology

## 2021-02-16 DIAGNOSIS — I771 Stricture of artery: Secondary | ICD-10-CM | POA: Diagnosis not present

## 2021-02-16 NOTE — Progress Notes (Signed)
Carotid artery duplex completed. Refer to "CV Proc" under chart review to view preliminary results.  02/16/2021 2:28 PM Eula Fried., MHA, RVT, RDCS, RDMS

## 2021-02-22 ENCOUNTER — Telehealth (HOSPITAL_COMMUNITY): Payer: Self-pay

## 2021-02-22 NOTE — Telephone Encounter (Signed)
Pt agreed to f/u in 6 months with us carotid. AW 

## 2021-02-24 NOTE — Progress Notes (Signed)
Carelink Summary Report / Loop Recorder 

## 2021-03-07 ENCOUNTER — Ambulatory Visit (INDEPENDENT_AMBULATORY_CARE_PROVIDER_SITE_OTHER): Payer: Commercial Managed Care - PPO

## 2021-03-07 DIAGNOSIS — I639 Cerebral infarction, unspecified: Secondary | ICD-10-CM

## 2021-03-09 LAB — CUP PACEART REMOTE DEVICE CHECK
Date Time Interrogation Session: 20220906015511
Implantable Pulse Generator Implant Date: 20200721

## 2021-03-16 NOTE — Progress Notes (Signed)
Carelink Summary Report / Loop Recorder 

## 2021-04-11 ENCOUNTER — Ambulatory Visit (INDEPENDENT_AMBULATORY_CARE_PROVIDER_SITE_OTHER): Payer: Commercial Managed Care - PPO

## 2021-04-11 DIAGNOSIS — I639 Cerebral infarction, unspecified: Secondary | ICD-10-CM | POA: Diagnosis not present

## 2021-04-13 LAB — CUP PACEART REMOTE DEVICE CHECK
Date Time Interrogation Session: 20221009015942
Implantable Pulse Generator Implant Date: 20200721

## 2021-04-20 NOTE — Progress Notes (Signed)
Carelink Summary Report / Loop Recorder 

## 2021-05-16 ENCOUNTER — Ambulatory Visit (INDEPENDENT_AMBULATORY_CARE_PROVIDER_SITE_OTHER): Payer: Commercial Managed Care - PPO

## 2021-05-16 DIAGNOSIS — I639 Cerebral infarction, unspecified: Secondary | ICD-10-CM

## 2021-05-17 LAB — CUP PACEART REMOTE DEVICE CHECK
Date Time Interrogation Session: 20221120234319
Implantable Pulse Generator Implant Date: 20200721

## 2021-05-25 NOTE — Progress Notes (Signed)
Carelink Summary Report / Loop Recorder 

## 2021-06-17 ENCOUNTER — Ambulatory Visit (INDEPENDENT_AMBULATORY_CARE_PROVIDER_SITE_OTHER): Payer: Commercial Managed Care - PPO

## 2021-06-17 DIAGNOSIS — I639 Cerebral infarction, unspecified: Secondary | ICD-10-CM

## 2021-06-20 LAB — CUP PACEART REMOTE DEVICE CHECK
Date Time Interrogation Session: 20221223231547
Implantable Pulse Generator Implant Date: 20200721

## 2021-06-28 NOTE — Progress Notes (Signed)
Carelink Summary Report / Loop Recorder 

## 2021-07-25 ENCOUNTER — Ambulatory Visit (INDEPENDENT_AMBULATORY_CARE_PROVIDER_SITE_OTHER): Payer: Commercial Managed Care - PPO

## 2021-07-25 DIAGNOSIS — I639 Cerebral infarction, unspecified: Secondary | ICD-10-CM

## 2021-07-25 LAB — CUP PACEART REMOTE DEVICE CHECK
Date Time Interrogation Session: 20230129232403
Implantable Pulse Generator Implant Date: 20200721

## 2021-08-02 NOTE — Progress Notes (Signed)
Carelink Summary Report / Loop Recorder 

## 2021-08-24 ENCOUNTER — Other Ambulatory Visit (HOSPITAL_COMMUNITY): Payer: Self-pay | Admitting: Interventional Radiology

## 2021-08-24 DIAGNOSIS — I639 Cerebral infarction, unspecified: Secondary | ICD-10-CM

## 2021-08-29 ENCOUNTER — Ambulatory Visit (INDEPENDENT_AMBULATORY_CARE_PROVIDER_SITE_OTHER): Payer: Commercial Managed Care - PPO

## 2021-08-29 DIAGNOSIS — I639 Cerebral infarction, unspecified: Secondary | ICD-10-CM | POA: Diagnosis not present

## 2021-08-30 LAB — CUP PACEART REMOTE DEVICE CHECK
Date Time Interrogation Session: 20230303232734
Implantable Pulse Generator Implant Date: 20200721

## 2021-09-01 ENCOUNTER — Ambulatory Visit (HOSPITAL_COMMUNITY)
Admission: RE | Admit: 2021-09-01 | Discharge: 2021-09-01 | Disposition: A | Discharge status: Home / Self Care | Payer: Commercial Managed Care - PPO | Source: Ambulatory Visit | Attending: Interventional Radiology | Admitting: Interventional Radiology

## 2021-09-01 ENCOUNTER — Other Ambulatory Visit: Payer: Self-pay

## 2021-09-01 DIAGNOSIS — I639 Cerebral infarction, unspecified: Secondary | ICD-10-CM

## 2021-09-06 ENCOUNTER — Telehealth (HOSPITAL_COMMUNITY): Payer: Self-pay

## 2021-09-06 NOTE — Telephone Encounter (Signed)
Pt agreed to f/u in 6 months with a us carotid. AW 

## 2021-09-09 NOTE — Progress Notes (Signed)
Carelink Summary Report / Loop Recorder 

## 2021-10-03 ENCOUNTER — Ambulatory Visit (INDEPENDENT_AMBULATORY_CARE_PROVIDER_SITE_OTHER): Payer: Commercial Managed Care - PPO

## 2021-10-03 DIAGNOSIS — I639 Cerebral infarction, unspecified: Secondary | ICD-10-CM | POA: Diagnosis not present

## 2021-10-05 LAB — CUP PACEART REMOTE DEVICE CHECK
Date Time Interrogation Session: 20230406003738
Implantable Pulse Generator Implant Date: 20200721

## 2021-10-13 IMAGING — MR MR BRAIN/IAC WO/W CM
11 of 12 series · 35 of 48 positions shown · IV contrast (15 ml multihance)
Comparison: CT angiogram head 07/07/2019, brain MRI 01/13/2019

CLINICAL DATA: Cerebrovascular accident due to occlusion of right
middle cerebral artery. Cholesterol granuloma of middle ear.
Additional history provided: Cyst in right ear, decreased hearing
right ear for 6 months, history of squamous cell carcinoma.

Creatinine was obtained on site at [HOSPITAL] at [HOSPITAL].
Results: Creatinine 1.0 mg/dL (GFR 80).
EXAM:
MRI HEAD WITHOUT AND WITH CONTRAST
TECHNIQUE: Multiplanar, multiecho pulse sequences of the brain and surrounding
structures were obtained without and with intravenous contrast.
CONTRAST:  75mL 3DD8K4-9OL IOPAMIDOL (3DD8K4-9OL) INJECTION 76%

[Series 2: T1 · sagittal · 5.0mm · 0.45mm/px · 2 of 21 slices shown (1 of 4)]
[im 1/21]
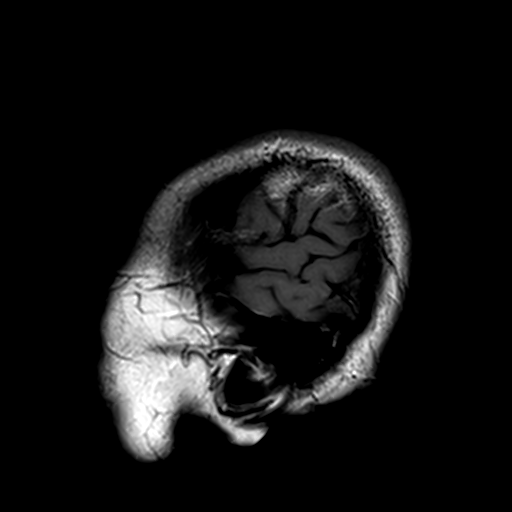
[im 21/21]
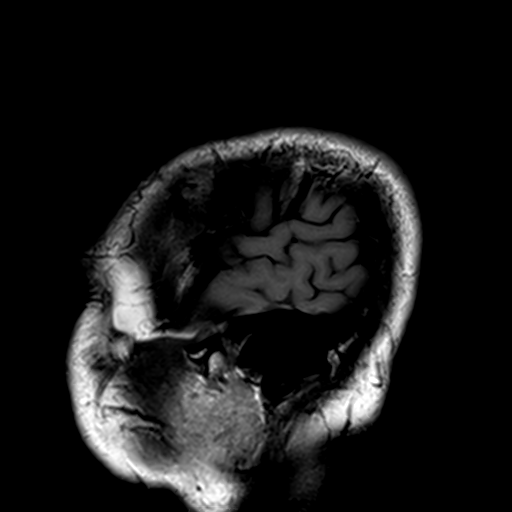

[Series 3: ep2d_diff_3 · axial · 3.0mm · 1.88mm/px · z∈[-73,+74]mm · 8 of 99 slices shown]
[im 1/99]
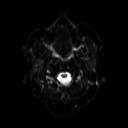
[im 11/99]
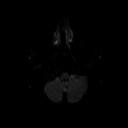
[im 33/99]
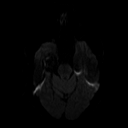
[im 44/99]
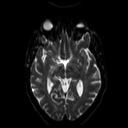
[im 55/99]
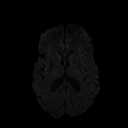
[im 66/99]
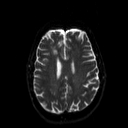
[im 88/99]
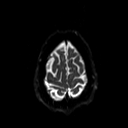
[im 99/99]
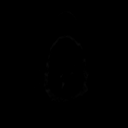

[Series 4: ep2d_diff_3_adc · axial · 3.0mm · 1.88mm/px · z∈[-73,+74]mm · 4 of 46 slices shown]
[im 1/46]
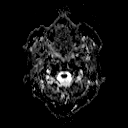
[im 16/46]
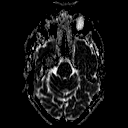
[im 31/46]
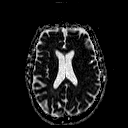
[im 46/46]
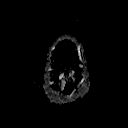

[Series 5: t2_tse_tra_512 · axial · 5.0mm · 0.62mm/px · z∈[-71,+72]mm · 2 of 24 slices shown]
[im 1/24]
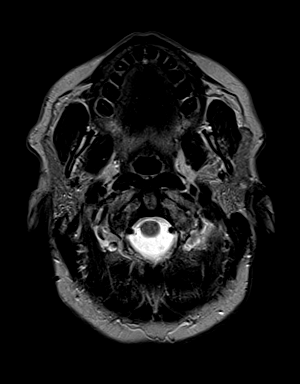
[im 24/24]
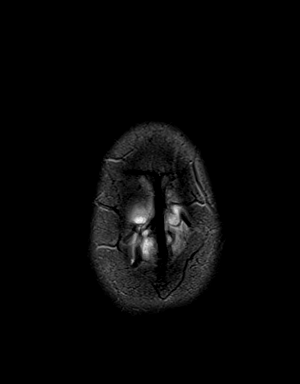

[Series 7: swi_images · axial · 2.0mm · 0.94mm/px · z∈[-78,+80]mm · 8 of 80 slices shown]
[im 1/80]
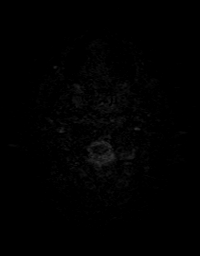
[im 12/80]
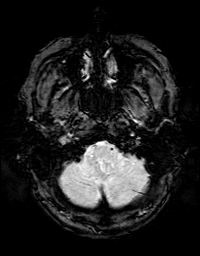
[im 23/80]
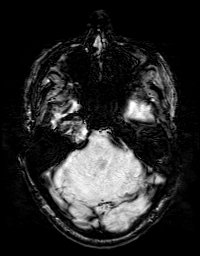
[im 34/80]
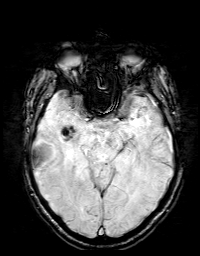
[im 46/80]
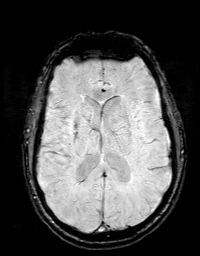
[im 57/80]
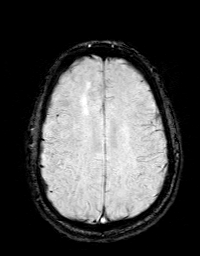
[im 68/80]
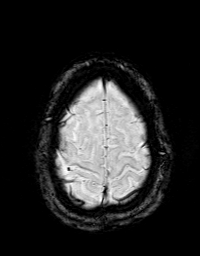
[im 80/80]
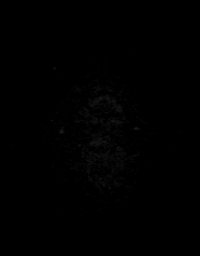

[Series 8: FLAIR · axial · 3.0mm · 0.47mm/px · z∈[-78,+78]mm · 3 of 27 slices shown]
[im 1/27]
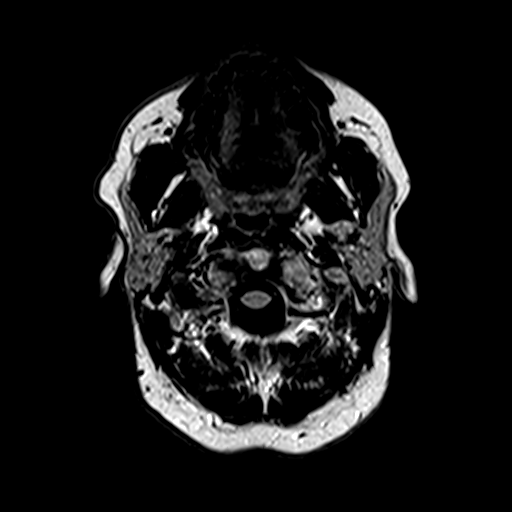
[im 14/27]
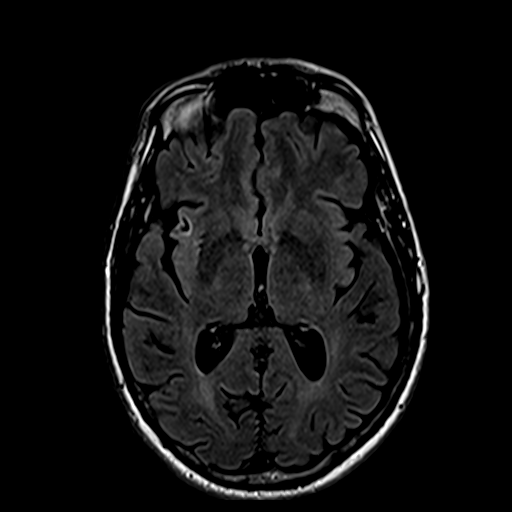
[im 27/27]
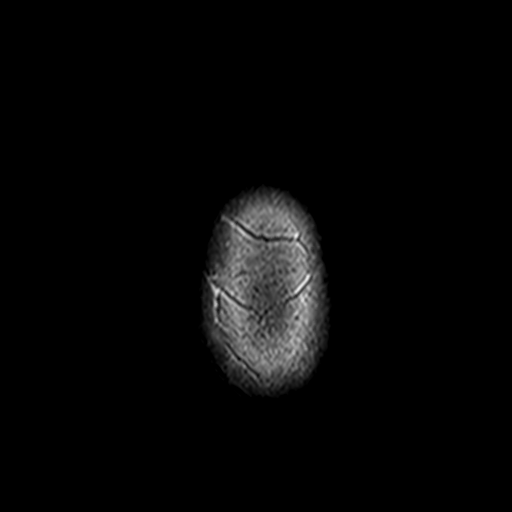

[Series 9: T1 · coronal · 3.0mm · 0.35mm/px · 2 of 16 slices shown (2 of 4)]
[im 1/16]
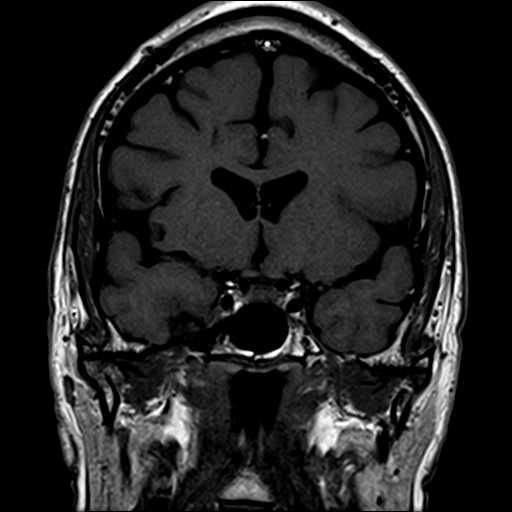
[im 16/16]
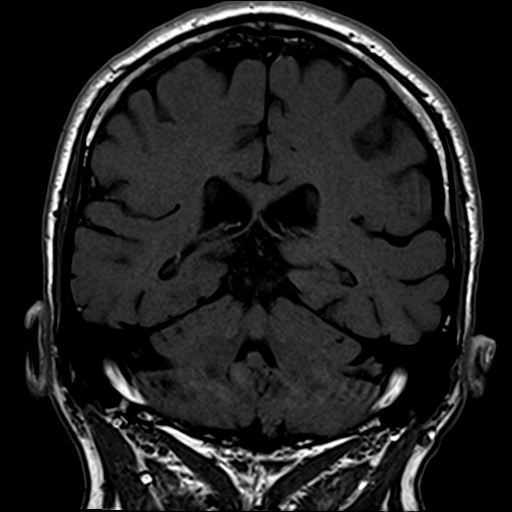

[Series 10: T1 · axial · 3.0mm · 0.35mm/px · 1 of 15 slices shown (3 of 4)]
[im 1/15]
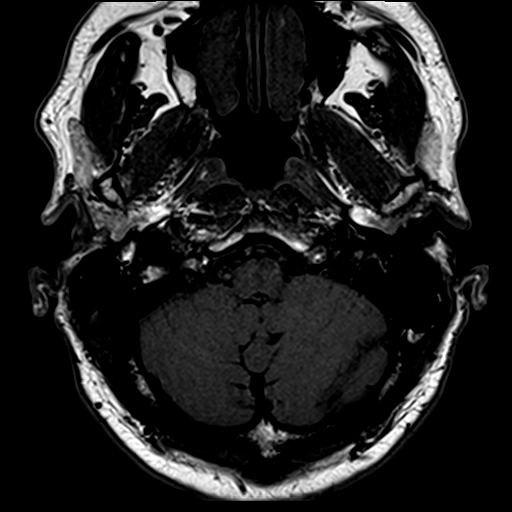

[Series 11: bSSFP · axial · 0.7mm · 0.28mm/px · z∈[-52,-44]mm · 2 of 52 slices shown]
[im 1/52]
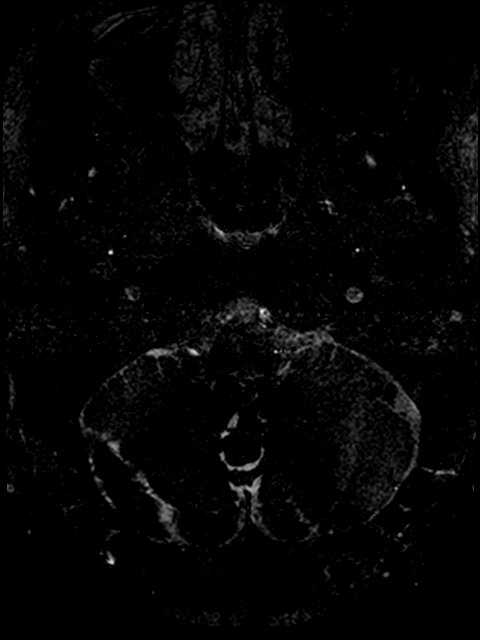
[im 13/52]
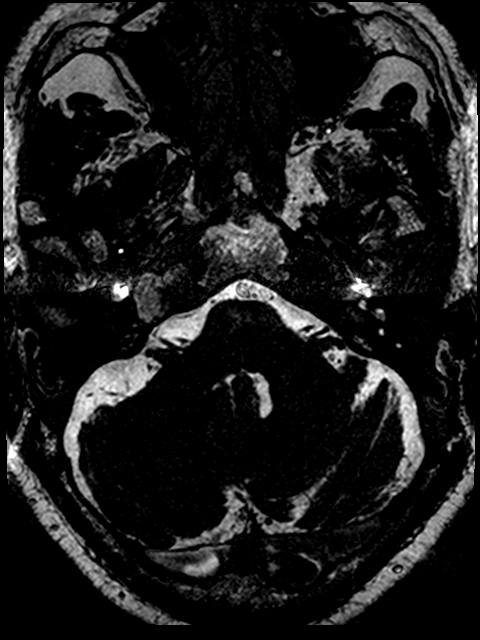

[Series 12: T1 · coronal · 3.0mm · 0.35mm/px · 2 of 16 slices shown (4 of 4)]
[im 1/16]
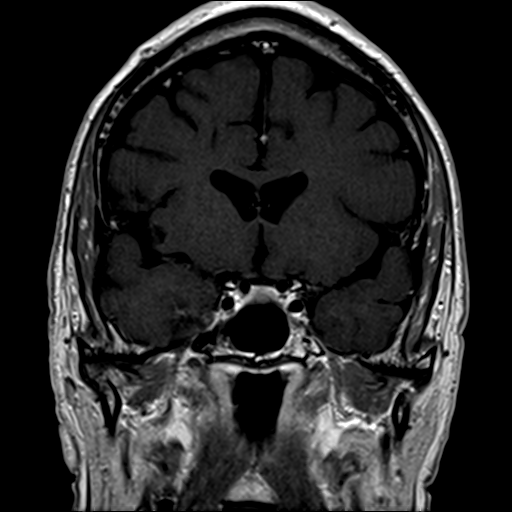
[im 16/16]
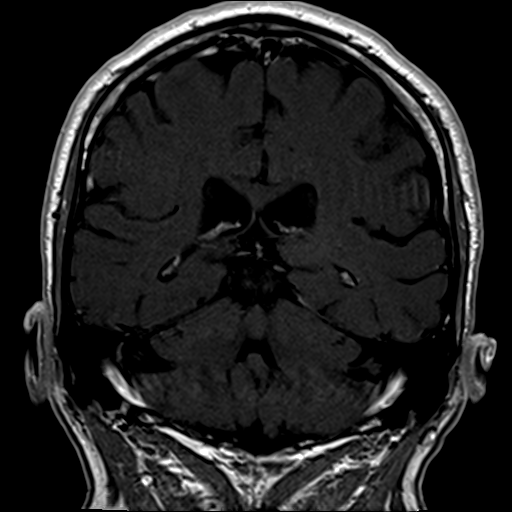

[Series 13: T1 post-contrast · axial · 3.0mm · 0.35mm/px · 1 of 15 slices shown]
[im 1/15]
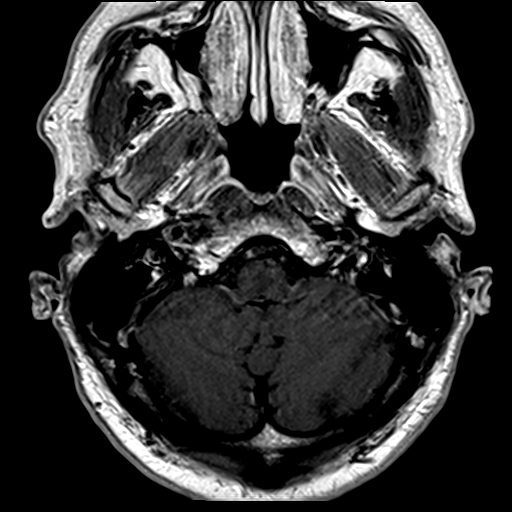

[35 of 48 positions shown; findings below may reference images not displayed]

FINDINGS: Brain:

Not significantly changed in size as compared to MRI 01/12/2009,
there is an expansile lesion centered within the right petrous apex
measuring 2.7 x 3.0 x 3.3 cm (AP x TV x CC) (remeasured on prior).
As before, this lesion demonstrates significant precontrast T1
hyperintensity and a low signal rim. Imaging features are again most
consistent with cholesterol granuloma. The mass extends posteriorly
to encroach upon the right internal auditory canal. However, no
intrinsic internal auditory canal lesion is identified.

There is no evidence of acute infarct.

No midline shift or extra-axial fluid collection.

No chronic intracranial blood products within the brain parenchyma.

Redemonstrated chronic white matter infarct within the high right
frontal lobe. Also redemonstrated are chronic lacunar infarcts
within the lateral right basal ganglia and anterior right insula.

No abnormal intracranial enhancement is identified.

Vascular: Flow voids maintained within the proximal large arterial
vessels.

Skull and upper cervical spine: Apart from the right petrous apex
lesion, no other focal marrow lesion is identified.

Sinuses/Orbits: Visualized orbits demonstrate no acute abnormality.
Minimal ethmoid sinus mucosal thickening. No significant mastoid
effusion.
IMPRESSION: Unchanged 3.3 cm expansile lesion centered within the right petrous
apex with imaging features most consistent with cholesterol
granuloma. The posterior aspect of the mass encroaches upon the
right internal auditory canal. However, no intrinsic internal
auditory canal lesion is identified.

No evidence of acute intracranial abnormality.

Redemonstrated chronic infarcts within the right frontal white
matter, right basal ganglia and right insula.

## 2021-10-20 NOTE — Progress Notes (Signed)
Carelink Summary Report / Loop Recorder 

## 2021-11-07 ENCOUNTER — Ambulatory Visit (INDEPENDENT_AMBULATORY_CARE_PROVIDER_SITE_OTHER): Payer: Commercial Managed Care - PPO

## 2021-11-07 DIAGNOSIS — I639 Cerebral infarction, unspecified: Secondary | ICD-10-CM | POA: Diagnosis not present

## 2021-11-09 LAB — CUP PACEART REMOTE DEVICE CHECK
Date Time Interrogation Session: 20230510230414
Implantable Pulse Generator Implant Date: 20200721

## 2021-11-28 NOTE — Progress Notes (Signed)
Carelink Summary Report / Loop Recorder 

## 2021-12-12 ENCOUNTER — Ambulatory Visit (INDEPENDENT_AMBULATORY_CARE_PROVIDER_SITE_OTHER): Payer: Commercial Managed Care - PPO

## 2021-12-12 DIAGNOSIS — I639 Cerebral infarction, unspecified: Secondary | ICD-10-CM

## 2021-12-14 LAB — CUP PACEART REMOTE DEVICE CHECK
Date Time Interrogation Session: 20230612231615
Implantable Pulse Generator Implant Date: 20200721

## 2022-01-04 NOTE — Progress Notes (Signed)
Carelink Summary Report / Loop Recorder 

## 2022-01-16 ENCOUNTER — Ambulatory Visit (INDEPENDENT_AMBULATORY_CARE_PROVIDER_SITE_OTHER): Payer: Commercial Managed Care - PPO

## 2022-01-16 DIAGNOSIS — I639 Cerebral infarction, unspecified: Secondary | ICD-10-CM

## 2022-01-17 LAB — CUP PACEART REMOTE DEVICE CHECK
Date Time Interrogation Session: 20230715231638
Implantable Pulse Generator Implant Date: 20200721

## 2022-02-20 ENCOUNTER — Ambulatory Visit (INDEPENDENT_AMBULATORY_CARE_PROVIDER_SITE_OTHER): Payer: Commercial Managed Care - PPO

## 2022-02-20 DIAGNOSIS — I639 Cerebral infarction, unspecified: Secondary | ICD-10-CM

## 2022-02-20 LAB — CUP PACEART REMOTE DEVICE CHECK
Date Time Interrogation Session: 20230817232110
Implantable Pulse Generator Implant Date: 20200721

## 2022-02-24 NOTE — Progress Notes (Signed)
Carelink Summary Report / Loop Recorder 

## 2022-03-16 NOTE — Progress Notes (Signed)
Carelink Summary Report / Loop Recorder 

## 2022-03-21 ENCOUNTER — Other Ambulatory Visit (HOSPITAL_COMMUNITY): Payer: Self-pay | Admitting: Interventional Radiology

## 2022-03-21 DIAGNOSIS — I639 Cerebral infarction, unspecified: Secondary | ICD-10-CM

## 2022-03-27 ENCOUNTER — Ambulatory Visit (HOSPITAL_COMMUNITY)
Admission: RE | Admit: 2022-03-27 | Discharge: 2022-03-27 | Disposition: A | Discharge status: Home / Self Care | Payer: Commercial Managed Care - PPO | Source: Ambulatory Visit | Attending: Interventional Radiology | Admitting: Interventional Radiology

## 2022-03-27 DIAGNOSIS — I639 Cerebral infarction, unspecified: Secondary | ICD-10-CM

## 2022-03-27 NOTE — Progress Notes (Signed)
Carotid duplex has been completed.   Preliminary results in CV Proc.   Zacariah Belue Destynie Toomey 03/27/2022 1:50 PM

## 2022-03-28 ENCOUNTER — Ambulatory Visit (INDEPENDENT_AMBULATORY_CARE_PROVIDER_SITE_OTHER): Payer: Commercial Managed Care - PPO

## 2022-03-28 DIAGNOSIS — I639 Cerebral infarction, unspecified: Secondary | ICD-10-CM | POA: Diagnosis not present

## 2022-03-28 LAB — CUP PACEART REMOTE DEVICE CHECK
Date Time Interrogation Session: 20231001232039
Implantable Pulse Generator Implant Date: 20200721

## 2022-03-30 ENCOUNTER — Telehealth (HOSPITAL_COMMUNITY): Payer: Self-pay

## 2022-03-30 NOTE — Telephone Encounter (Signed)
Pt wanted to know if he could do his ultrasound f/u in 1 year instead of 6 months. Per Dr. Estanislado Pandy, this is fine. I called an left pt a vm regarding this and told him to call if he had any questions. I will call him when it is time for his next f/u. AW

## 2022-04-10 NOTE — Progress Notes (Signed)
Carelink Summary Report / Loop Recorder 

## 2022-05-01 ENCOUNTER — Ambulatory Visit (INDEPENDENT_AMBULATORY_CARE_PROVIDER_SITE_OTHER): Payer: Commercial Managed Care - PPO

## 2022-05-01 DIAGNOSIS — I639 Cerebral infarction, unspecified: Secondary | ICD-10-CM

## 2022-05-03 LAB — CUP PACEART REMOTE DEVICE CHECK
Date Time Interrogation Session: 20231103232301
Implantable Pulse Generator Implant Date: 20200721

## 2022-06-05 ENCOUNTER — Ambulatory Visit (INDEPENDENT_AMBULATORY_CARE_PROVIDER_SITE_OTHER): Payer: Commercial Managed Care - PPO

## 2022-06-05 DIAGNOSIS — I639 Cerebral infarction, unspecified: Secondary | ICD-10-CM

## 2022-06-06 LAB — CUP PACEART REMOTE DEVICE CHECK
Date Time Interrogation Session: 20231210232144
Implantable Pulse Generator Implant Date: 20200721

## 2022-06-07 NOTE — Progress Notes (Signed)
Carelink Summary Report / Loop Recorder 

## 2022-07-08 LAB — CUP PACEART REMOTE DEVICE CHECK
Date Time Interrogation Session: 20240113000500
Implantable Pulse Generator Implant Date: 20200721

## 2022-07-10 ENCOUNTER — Telehealth: Payer: Self-pay

## 2022-07-10 ENCOUNTER — Ambulatory Visit: Payer: Commercial Managed Care - PPO

## 2022-07-10 NOTE — Telephone Encounter (Signed)
Attempted to contact Pt.  Left detailed message requesting call back.  ILR @ RRT   ILR reached RRT:   07/06/2022 Patient called, discussed options to leave device in or explanted.  Patient would like to ____________________  Marked "I" in Paceart: done Enter note in Paceart:  done Canceled future remotes: done Discontinued from website:  done Entered in Speciality Comments: done  Advised if further questions arise to please call the device clinic at [phone].

## 2022-07-12 NOTE — Telephone Encounter (Signed)
Left detailed message, also sent MyChart message to patient, closing encounter

## 2022-07-13 NOTE — Progress Notes (Signed)
[facility] Summary Report / Loop Recorder

## 2022-07-14 ENCOUNTER — Telehealth: Payer: Self-pay

## 2022-07-14 NOTE — Telephone Encounter (Signed)
Pt called regarding ILR removal or left it in place?    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  At this time, Pt states he wants to leave the ILR in, but will reach out to [facility] in the future, if he decides he wants Dr. Lovena Le to remove it.    Pt states that he and his wife are out of state in [place].  No follow up required.

## 2022-07-14 NOTE — Telephone Encounter (Signed)
-----  Message from Evans Lance, MD sent at 07/12/2022  9:37 PM EST ----- Merrilee Seashore, reach out to see if he would like for the ILR to be removed or left in. If he wants it out then schedule a clinic visit so that we can remove. GT

## 2023-12-12 ENCOUNTER — Ambulatory Visit: Attending: Internal Medicine | Admitting: Internal Medicine

## 2023-12-12 ENCOUNTER — Encounter: Payer: Self-pay | Admitting: Internal Medicine

## 2023-12-12 VITALS — BP 116/72 | HR 72 | Ht 66.0 in | Wt 168.4 lb

## 2023-12-12 DIAGNOSIS — I639 Cerebral infarction, unspecified: Secondary | ICD-10-CM

## 2023-12-12 NOTE — Progress Notes (Signed)
 HPI Mr. Wiacek, returns today after a long absence from our EP clinic. He is a pleasant 68 yo man with a h/o cryptogenic stroke, and underwent ILR insertion over 5 years ago. He has not been found to have a diagnosis. He has reached end of service and presents to consider ILR removal. He denies chest pain or sob.  Allergies  Allergen Reactions   Penicillins Rash    Did it involve swelling of the face/tongue/throat, SOB, or low BP? Unknown Did it involve sudden or severe rash/hives, skin peeling, or any reaction on the inside of your mouth or nose? Unknown Did you need to seek medical attention at a hospital or doctor's office? Unknown When did it last happen?   young child    If all above answers are "NO", may proceed with cephalosporin use.     Current Outpatient Medications  Medication Sig Dispense Refill   acetaminophen  (TYLENOL ) 500 MG tablet Take 1,000 mg by mouth every 6 (six) hours as needed for fever or headache (pain).     aspirin  81 MG chewable tablet Chew 1 tablet (81 mg total) by mouth daily. Or low dose 81 mg enteric coated aspirin      atorvastatin  (LIPITOR) 40 MG tablet Take 1 tablet (40 mg total) by mouth daily at 6 PM. (Patient taking differently: Take 40 mg by mouth at bedtime. ) 90 tablet 0   losartan (COZAAR) 25 MG tablet Take 25 mg by mouth daily.     Zinc 50 MG TABS Take 50 mg by mouth 2 (two) times daily.  (Patient not taking: Reported on 12/12/2023)     No current facility-administered medications for this visit.   Facility-Administered Medications Ordered in Other Visits  Medication Dose Route Frequency Provider Last Rate Last Admin   gadobenate dimeglumine  (MULTIHANCE ) injection 15 mL  15 mL Intravenous Once PRN Nereida Banning, PA         Past Medical History:  Diagnosis Date   Hyperlipemia    Stroke (HCC)     ROS:   All systems reviewed and negative except as noted in the HPI.   Past Surgical History:  Procedure Laterality Date    IR ANGIO INTRA EXTRACRAN SEL COM CAROTID INNOMINATE UNI L MOD SED  01/13/2019   IR ANGIO VERTEBRAL SEL SUBCLAVIAN INNOMINATE BILAT MOD SED  01/13/2019   IR CT HEAD LTD  01/13/2019   IR INTRAVSC STENT CERV CAROTID W/O EMB-PROT MOD SED INC ANGIO  01/13/2019   IR PERCUTANEOUS ART THROMBECTOMY/INFUSION INTRACRANIAL INC DIAG ANGIO  01/13/2019   LOOP RECORDER INSERTION N/A 01/14/2019   Procedure: LOOP RECORDER INSERTION;  Surgeon: Tammie Fall, MD;  Location: [facility];  Service: Cardiovascular;  Laterality: N/A;   RADIOLOGY WITH ANESTHESIA N/A 01/13/2019   Procedure: RADIOLOGY WITH ANESTHESIA;  Surgeon: Luellen Sages, MD;  Location: [facility] OR;  Service: Radiology;  Laterality: N/A;     Family History  Problem Relation Age of Onset   Parkinson's disease Mother    Heart disease Father      Social History   Socioeconomic History   Marital status: Married    Spouse name: Not on file   Number of children: Not on file   Years of education: Not on file   Highest education level: Not on file  Occupational History   Not on file  Tobacco Use   Smoking status: Never   Smokeless tobacco: Never  Vaping Use   Vaping status: Never Used  Substance and Sexual Activity   Alcohol use: Yes    Comment: occassionally   Drug use: Never   Sexual activity: Not on file  Other Topics Concern   Not on file  Social History Narrative   Not on file   Social Drivers of Health   Financial Resource Strain: Not on file  Food Insecurity: Not on file  Transportation Needs: Not on file  Physical Activity: Not on file  Stress: Not on file  Social Connections: Not on file  Intimate Partner Violence: Not on file     BP 116/72   Pulse 72   Ht 5' 6 (1.676 m)   Wt 168 lb 6.4 oz (76.4 kg)   SpO2 96%   BMI 27.18 kg/m   Physical Exam:  Well appearing NAD HEENT: Unremarkable Neck:  No JVD, no thyromegally Lymphatics:  No adenopathy Back:  No CVA tenderness Lungs:  Clear HEART:  Regular rate  rhythm, no murmurs, no rubs, no clicks Abd:  soft, positive bowel sounds, no organomegally, no rebound, no guarding Ext:  2 plus pulses, no edema, no cyanosis, no clubbing Skin:  No rashes no nodules Neuro:  CN II through XII intact, motor grossly intact   DEVICE  Past RRT.  Assess/Plan: Cryptogenic stroke - no explanation by his ILR. I have offered him ILR removal and he wishes to proceed.  EP procedure Note  Procedure performed: ILR removal.  Preop diagnosis: cryptogenic stroke s/p ILR with the device past end of service.  Post op diagnosis: same as preop.  Description of the procedure: after informed consent was obtained, and the appropriate time out observed, the patient was prepped and draped in a sterile manner. 5 cc of lidocaine  was infiltrated into the left pectoral region. A one cm stab incision was carried out. A combination of blunt and sharp dissection was utilized and the ILR was grasped and removed with gentle traction. Hemostasis was assured and benzoin and steri strips were placed on the skin and the patient recovered in the usual manner.  Complications: none immediately  Conclusion: successful ILR removal.    Donnald Fuss

## 2023-12-12 NOTE — Patient Instructions (Addendum)
 Medication Instructions:  Your physician recommends that you continue on your current medications as directed. Please refer to the Current Medication list given to you today.  *If you need a refill on your cardiac medications before your next appointment, please call your pharmacy*  Lab Work: None ordered.  You may go to any [facility] Location for your lab work:  [facility] - [address] ([facility]) - [address] [phone] [address]  [place] - [address]   [place]  - [address]   [place] - [address] - [address] [facility]  - [address] [place] - [address] ([facility]   If you have labs (blood work) drawn today and your tests are completely normal, you will receive your results only by: [employer] (if you have MyChart)  If you have any lab test that is abnormal or we need to change your treatment, we will call you or send a MyChart message to review the results.  Testing/Procedures: None ordered.  Follow-Up:  Medication Instructions:  Your physician recommends that you continue on your current medications as directed. Please refer to the Current Medication list given to you today.  Labwork: None ordered.  Testing/Procedures: None ordered.  Follow-Up:  Implantable Loop Recorder Removal, Care After This sheet gives you information about how to care for yourself after your procedure. Your health care provider may also give you more specific instructions. If you have problems or questions, contact your health care provider. What can I expect after the procedure? After the procedure, it is common to have: Soreness or discomfort near the incision. Some swelling or bruising near the incision.  Follow these instructions at home: Incision care  Monitor your cardiac device site for redness, swelling, and drainage. Call the device  clinic at [phone] if you experience these symptoms or fever/chills.  Keep the large square bandage on your site for 24 hours and then you may remove it yourself. Keep the steri-strips underneath in place.   You may shower after 72 hours / 3 days from your procedure with the steri-strips in place. They will usually fall off on their own, or may be removed after 10 days. Pat dry.   Avoid lotions, ointments, or perfumes over your incision until it is well-healed.  Please do not submerge in water until your site is completely healed.   If your wound site starts to bleed apply pressure.       If you have any questions/concerns please call the device clinic at [phone].  Activity  Return to your normal activities.  Contact a health care provider if: You have redness, swelling, or pain around your incision. You have a fever.  At [facility], you and your health needs are our priority.  As part of our continuing mission to provide you with exceptional heart care, we have created designated Provider Care Teams.  These Care Teams include your primary Cardiologist (physician) and Advanced Practice Providers (APPs -  Physician Assistants and Nurse Practitioners) who all work together to provide you with the care you need, when you need it.  Your next appointment:   As needed  The format for your next appointment:   In Person  Provider:   Manya Sells, MD{or one of the following Advanced Practice Providers on your designated Care Team:   Mertha Abrahams, [place] Merla Starch,  PA-C Neda Balk, NP

## 2023-12-21 ENCOUNTER — Encounter (HOSPITAL_COMMUNITY): Payer: Self-pay | Admitting: Interventional Radiology
